# Patient Record
Sex: Female | Born: 1935 | Race: White | Hispanic: No | Smoking: Former smoker
Health system: Southern US, Community
[De-identification: ages and names within clinical notes are randomized; demographics above are authoritative.]

## PROBLEM LIST (undated history)

## (undated) DIAGNOSIS — J45909 Unspecified asthma, uncomplicated: Secondary | ICD-10-CM

## (undated) DIAGNOSIS — F329 Major depressive disorder, single episode, unspecified: Secondary | ICD-10-CM

## (undated) DIAGNOSIS — E539 Vitamin B deficiency, unspecified: Secondary | ICD-10-CM

## (undated) DIAGNOSIS — R51 Headache: Secondary | ICD-10-CM

## (undated) DIAGNOSIS — K219 Gastro-esophageal reflux disease without esophagitis: Secondary | ICD-10-CM

## (undated) DIAGNOSIS — H409 Unspecified glaucoma: Secondary | ICD-10-CM

## (undated) DIAGNOSIS — R54 Age-related physical debility: Secondary | ICD-10-CM

## (undated) DIAGNOSIS — N3281 Overactive bladder: Secondary | ICD-10-CM

## (undated) DIAGNOSIS — F0390 Unspecified dementia without behavioral disturbance: Secondary | ICD-10-CM

## (undated) DIAGNOSIS — D649 Anemia, unspecified: Secondary | ICD-10-CM

## (undated) DIAGNOSIS — I499 Cardiac arrhythmia, unspecified: Secondary | ICD-10-CM

## (undated) DIAGNOSIS — T8859XA Other complications of anesthesia, initial encounter: Secondary | ICD-10-CM

## (undated) DIAGNOSIS — E5111 Dry beriberi: Secondary | ICD-10-CM

## (undated) DIAGNOSIS — G4700 Insomnia, unspecified: Secondary | ICD-10-CM

## (undated) DIAGNOSIS — J209 Acute bronchitis, unspecified: Secondary | ICD-10-CM

## (undated) DIAGNOSIS — I471 Supraventricular tachycardia: Secondary | ICD-10-CM

## (undated) DIAGNOSIS — E119 Type 2 diabetes mellitus without complications: Secondary | ICD-10-CM

## (undated) DIAGNOSIS — I509 Heart failure, unspecified: Secondary | ICD-10-CM

## (undated) DIAGNOSIS — E785 Hyperlipidemia, unspecified: Secondary | ICD-10-CM

## (undated) DIAGNOSIS — J309 Allergic rhinitis, unspecified: Secondary | ICD-10-CM

## (undated) DIAGNOSIS — T4145XA Adverse effect of unspecified anesthetic, initial encounter: Secondary | ICD-10-CM

## (undated) DIAGNOSIS — I10 Essential (primary) hypertension: Secondary | ICD-10-CM

## (undated) DIAGNOSIS — R519 Headache, unspecified: Secondary | ICD-10-CM

## (undated) DIAGNOSIS — I4719 Other supraventricular tachycardia: Secondary | ICD-10-CM

## (undated) DIAGNOSIS — R0600 Dyspnea, unspecified: Secondary | ICD-10-CM

## (undated) DIAGNOSIS — K5900 Constipation, unspecified: Secondary | ICD-10-CM

## (undated) DIAGNOSIS — M810 Age-related osteoporosis without current pathological fracture: Secondary | ICD-10-CM

## (undated) DIAGNOSIS — H04129 Dry eye syndrome of unspecified lacrimal gland: Secondary | ICD-10-CM

## (undated) DIAGNOSIS — E876 Hypokalemia: Secondary | ICD-10-CM

## (undated) DIAGNOSIS — F32A Depression, unspecified: Secondary | ICD-10-CM

## (undated) DIAGNOSIS — J449 Chronic obstructive pulmonary disease, unspecified: Secondary | ICD-10-CM

## (undated) DIAGNOSIS — H269 Unspecified cataract: Secondary | ICD-10-CM

## (undated) HISTORY — PX: DILATION AND CURETTAGE OF UTERUS: SHX78

## (undated) HISTORY — DX: Unspecified cataract: H26.9

## (undated) HISTORY — DX: Type 2 diabetes mellitus without complications: E11.9

## (undated) HISTORY — PX: DILATION AND CURETTAGE, DIAGNOSTIC / THERAPEUTIC: SUR384

## (undated) HISTORY — DX: Age-related osteoporosis without current pathological fracture: M81.0

## (undated) HISTORY — DX: Headache: R51

## (undated) HISTORY — PX: FOOT SURGERY: SHX648

## (undated) HISTORY — PX: HAND SURGERY: SHX662

## (undated) HISTORY — DX: Headache, unspecified: R51.9

## (undated) HISTORY — PX: ANKLE SURGERY: SHX546

## (undated) HISTORY — DX: Essential (primary) hypertension: I10

## (undated) HISTORY — PX: EYE SURGERY: SHX253

## (undated) HISTORY — DX: Heart failure, unspecified: I50.9

---

## 1998-06-06 ENCOUNTER — Other Ambulatory Visit: Admission: RE | Admit: 1998-06-06 | Discharge: 1998-06-06 | Payer: Self-pay | Admitting: Family Medicine

## 1999-05-07 ENCOUNTER — Ambulatory Visit (HOSPITAL_COMMUNITY): Admission: RE | Admit: 1999-05-07 | Discharge: 1999-05-07 | Payer: Self-pay | Admitting: Family Medicine

## 1999-06-23 ENCOUNTER — Other Ambulatory Visit: Admission: RE | Admit: 1999-06-23 | Discharge: 1999-06-23 | Payer: Self-pay | Admitting: Family Medicine

## 2000-09-07 ENCOUNTER — Encounter: Payer: Self-pay | Admitting: Family Medicine

## 2000-09-07 ENCOUNTER — Encounter: Admission: RE | Admit: 2000-09-07 | Discharge: 2000-09-07 | Payer: Self-pay | Admitting: Family Medicine

## 2001-06-21 ENCOUNTER — Encounter: Payer: Self-pay | Admitting: Emergency Medicine

## 2001-06-21 ENCOUNTER — Emergency Department (HOSPITAL_COMMUNITY): Admission: EM | Admit: 2001-06-21 | Discharge: 2001-06-21 | Payer: Self-pay | Admitting: Emergency Medicine

## 2001-07-31 ENCOUNTER — Emergency Department (HOSPITAL_COMMUNITY): Admission: AC | Admit: 2001-07-31 | Discharge: 2001-07-31 | Payer: Self-pay

## 2001-07-31 ENCOUNTER — Encounter: Payer: Self-pay | Admitting: Emergency Medicine

## 2001-09-18 ENCOUNTER — Other Ambulatory Visit: Admission: RE | Admit: 2001-09-18 | Discharge: 2001-09-18 | Payer: Self-pay | Admitting: Family Medicine

## 2002-10-09 ENCOUNTER — Other Ambulatory Visit: Admission: RE | Admit: 2002-10-09 | Discharge: 2002-10-09 | Payer: Self-pay | Admitting: Obstetrics and Gynecology

## 2002-11-16 ENCOUNTER — Emergency Department (HOSPITAL_COMMUNITY): Admission: EM | Admit: 2002-11-16 | Discharge: 2002-11-17 | Payer: Self-pay | Admitting: Emergency Medicine

## 2002-11-16 ENCOUNTER — Encounter: Payer: Self-pay | Admitting: Emergency Medicine

## 2003-01-25 ENCOUNTER — Ambulatory Visit (HOSPITAL_COMMUNITY): Admission: RE | Admit: 2003-01-25 | Discharge: 2003-01-25 | Payer: Self-pay | Admitting: Cardiology

## 2003-06-06 ENCOUNTER — Emergency Department (HOSPITAL_COMMUNITY): Admission: EM | Admit: 2003-06-06 | Discharge: 2003-06-06 | Payer: Self-pay | Admitting: Emergency Medicine

## 2003-06-17 ENCOUNTER — Encounter: Admission: RE | Admit: 2003-06-17 | Discharge: 2003-06-17 | Payer: Self-pay | Admitting: Family Medicine

## 2004-11-24 ENCOUNTER — Other Ambulatory Visit: Admission: RE | Admit: 2004-11-24 | Discharge: 2004-11-24 | Payer: Self-pay | Admitting: Family Medicine

## 2004-12-10 ENCOUNTER — Ambulatory Visit: Payer: Self-pay | Admitting: Internal Medicine

## 2004-12-29 ENCOUNTER — Ambulatory Visit: Payer: Self-pay | Admitting: Internal Medicine

## 2009-01-06 ENCOUNTER — Ambulatory Visit (HOSPITAL_COMMUNITY): Admission: RE | Admit: 2009-01-06 | Discharge: 2009-01-06 | Payer: Self-pay | Admitting: Ophthalmology

## 2009-03-03 ENCOUNTER — Ambulatory Visit (HOSPITAL_COMMUNITY): Admission: RE | Admit: 2009-03-03 | Discharge: 2009-03-03 | Payer: Self-pay | Admitting: Ophthalmology

## 2009-07-26 HISTORY — PX: KNEE SURGERY: SHX244

## 2010-07-07 ENCOUNTER — Encounter
Admission: RE | Admit: 2010-07-07 | Discharge: 2010-07-07 | Payer: Self-pay | Source: Home / Self Care | Attending: Family Medicine | Admitting: Family Medicine

## 2010-09-11 ENCOUNTER — Other Ambulatory Visit: Payer: Self-pay | Admitting: Orthopedic Surgery

## 2010-09-11 DIAGNOSIS — R609 Edema, unspecified: Secondary | ICD-10-CM

## 2010-09-14 ENCOUNTER — Ambulatory Visit
Admission: RE | Admit: 2010-09-14 | Discharge: 2010-09-14 | Disposition: A | Discharge status: Home / Self Care | Payer: Medicare Other | Source: Ambulatory Visit | Attending: Orthopedic Surgery | Admitting: Orthopedic Surgery

## 2010-09-14 DIAGNOSIS — R609 Edema, unspecified: Secondary | ICD-10-CM

## 2010-11-01 LAB — CBC
HCT: 34.3 % — ABNORMAL LOW (ref 36.0–46.0)
Hemoglobin: 11.4 g/dL — ABNORMAL LOW (ref 12.0–15.0)
MCHC: 33.3 g/dL (ref 30.0–36.0)
MCV: 81.8 fL (ref 78.0–100.0)
Platelets: 225 10*3/uL (ref 150–400)
RBC: 4.19 MIL/uL (ref 3.87–5.11)
RDW: 15.3 % (ref 11.5–15.5)
WBC: 7.2 10*3/uL (ref 4.0–10.5)

## 2010-11-01 LAB — BASIC METABOLIC PANEL
BUN: 14 mg/dL (ref 6–23)
CO2: 29 mEq/L (ref 19–32)
Calcium: 9.3 mg/dL (ref 8.4–10.5)
Chloride: 101 mEq/L (ref 96–112)
Creatinine, Ser: 0.83 mg/dL (ref 0.4–1.2)
GFR calc Af Amer: >60 mL/min (ref >60)
GFR calc non Af Amer: >60 mL/min (ref >60)
Glucose, Bld: 165 mg/dL — ABNORMAL HIGH (ref 70–99)
Potassium: 3.6 mEq/L (ref 3.5–5.1)
Sodium: 138 mEq/L (ref 135–145)

## 2010-11-01 LAB — GLUCOSE, CAPILLARY
Glucose-Capillary: 132 mg/dL — ABNORMAL HIGH (ref 70–99)
Glucose-Capillary: 160 mg/dL — ABNORMAL HIGH (ref 70–99)

## 2010-11-01 LAB — PROTIME-INR
INR: 0.9 (ref 0.00–1.49)
Prothrombin Time: 11.8 seconds (ref 11.6–15.2)

## 2010-11-01 LAB — APTT: aPTT: 27 seconds (ref 24–37)

## 2010-11-02 LAB — CBC
HCT: 36.3 % (ref 36.0–46.0)
Hemoglobin: 12.1 g/dL (ref 12.0–15.0)
MCHC: 33.3 g/dL (ref 30.0–36.0)
MCV: 83.2 fL (ref 78.0–100.0)
Platelets: 266 10*3/uL (ref 150–400)
RBC: 4.36 MIL/uL (ref 3.87–5.11)
RDW: 15.4 % (ref 11.5–15.5)
WBC: 8.3 10*3/uL (ref 4.0–10.5)

## 2010-11-02 LAB — BASIC METABOLIC PANEL
BUN: 13 mg/dL (ref 6–23)
CO2: 30 mEq/L (ref 19–32)
Calcium: 9.7 mg/dL (ref 8.4–10.5)
Chloride: 102 mEq/L (ref 96–112)
Creatinine, Ser: 0.85 mg/dL (ref 0.4–1.2)
GFR calc Af Amer: >60 mL/min (ref >60)
GFR calc non Af Amer: >60 mL/min (ref >60)
Glucose, Bld: 122 mg/dL — ABNORMAL HIGH (ref 70–99)
Potassium: 3.8 mEq/L (ref 3.5–5.1)
Sodium: 139 mEq/L (ref 135–145)

## 2010-11-02 LAB — PROTIME-INR
INR: 0.9 (ref 0.00–1.49)
Prothrombin Time: 12.3 seconds (ref 11.6–15.2)

## 2010-11-02 LAB — GLUCOSE, CAPILLARY
Glucose-Capillary: 117 mg/dL — ABNORMAL HIGH (ref 70–99)
Glucose-Capillary: 96 mg/dL (ref 70–99)

## 2010-11-02 LAB — APTT: aPTT: 29 seconds (ref 24–37)

## 2010-12-08 NOTE — Op Note (Signed)
NAME:  Jocelyn Schultz, Jocelyn Schultz               ACCOUNT NO.:  [id number]   MEDICAL RECORD NO.:  [id number]          PATIENT TYPE:  AMB   LOCATION:  SDS                          FACILITY:  [facility]   PHYSICIAN:  Alford Highland. Rankin, M.D.   DATE OF BIRTH:  [birth year]   DATE OF PROCEDURE:  01/06/2009  DATE OF DISCHARGE:                               OPERATIVE REPORT   PREOPERATIVE DIAGNOSIS:  Nuclear sclerotic cataract, left eye with  impact of visual function.   POSTOPERATIVE DIAGNOSIS:  Nuclear sclerotic cataract, left eye with  impact of visual function.   PROCEDURE:  Phacoemulsification, extracapsular cataract extraction with  intraocular lens placement posterior chamber in the capsular bag, left  eye.   ANESTHESIA:  Local retrobulbar, monitored anesthesia control.   SURGEON:  Alford Highland. Rankin, MD   INDICATIONS FOR PROCEDURE:  The patient is a 77 year old woman who has  profound vision loss on basis of nuclear sclerotic cataract and  cloudiness of vision.  She understands this is an attempt to remove the  cloudiness opacity into place with intraocular lens in place, which is  clear to enhance visual function.  The patient understood the risk of  anesthesia including rare occurrence of death, loss of the eye including  but not limited to hemorrhage, infection, scarring, need for another  surgery, no change in vision, loss of vision, and progressive disease  despite intervention.   After appropriate signed consent was obtained, the patient was taken to  the operating room.  In the operating room, appropriate monitors  followed by mild sedation.  Xylocaine 2% injected, 5 mL retrobulbar with  additional 5 mL laterally in fashion of modified Darel Hong.  The left  periocular region was sterilely prepped and draped in the usual sterile  fashion.  Lid speculum applied.  Diamond blade was then used to create a  biplanar clear corneal incision.  Anterior chamber was then opened up.  The anterior chamber was  deepened with Viscoat.  Bent cystitome needle  was then created and this was used to create a circular capsulorrhexis.  No complications occurred.  Hydrodissection delineation was then carried  out in the bag.  Phacoemulsification was then carried out and a Y groove  incision was then made and nucleus chopper was then used to crack the  lens into fragments.  This allowed for removal in segments.  The plate  was left over and this nuclear plate was able to be removed without  difficulty.  Irrigation aspiration was then used to clear the remaining  cortex.  Capsular bag ring was intact.  At this time, the [employer] Ac[employer]of  IQ lens was then placed into the capsular bag left in the vertical  position with excellent centration with haptics and the optic in the  bag.  The power was +18.5, serial number is [id number] and the style  was [id number].  The patient then had clean up of the Viscoat in the  anterior chamber.  The wound was then closed with a safety 10-0  nylon suture and the knot was bury to rotate.  Subconjunctival Decadron  applied.  Sterile patch and Fox shield applied.  The patient tolerated  the procedure without complication.  The patient was taken to short stay  in good and stable condition.      Alford Highland Rankin, M.D.  Electronically Signed     GAR/MEDQ  D:  01/06/2009  T:  01/07/2009  Job:  [id number]

## 2010-12-11 NOTE — Cardiovascular Report (Signed)
NAME:  Jocelyn Schultz, Jocelyn Schultz                         ACCOUNT NO.:  [id number]   MEDICAL RECORD NO.:  [id number]                   PATIENT TYPE:  OIB   LOCATION:  2899                                 FACILITY:  [facility]   PHYSICIAN:  Salvadore Farber, M.D.             DATE OF BIRTH:  06/22/1931   DATE OF PROCEDURE:  01/25/2003  DATE OF DISCHARGE:  01/25/2003                              CARDIAC CATHETERIZATION   PROCEDURE:  Left heart catheterization, right heart catheterization, left  ventriculography, coronary angiography, abdominal aortography, selective  right renal angiography.   INDICATION:  Ms. Lannan is a 77 year old lady with history of hypertension,  diabetes mellitus, and prior DVT who presents with several months of  progressive exertional dyspnea.  She also has some occasional substernal  chest discomfort occurring both with and without exertion.  Due to her risk  factors and marked dyspnea, she was referred for coronary angiography and  right heart catheterization to both measure pulmonary pressures and exclude  myocardial ischemias and etiology of her dyspnea.   PROCEDURAL TECHNIQUE:  Informed consent was obtained.  Under 1% lidocaine  local anesthesia, an 8 French sheath was placed in the right femoral vein  and a 6 French sheath in the right femoral artery using the modified  Seldinger technique.  Right heart catheterization was performed using a  balloon-tipped catheter.  Coronary angiography was performed using JL-4 and  JR-4 catheter.  Ventriculography was performed in the RAO projection by  power injection in the pigtail catheter.  The pigtail catheter was then  pulled back to the abdominal aorta.  Abdominal  aortography was performed  via power injection.  There was a questionable stenosis at the ostium of the  right renal artery on aortography.  Therefore, the JR-4 catheter was  reintroduced and used to selectively engage the right renal artery.  Angiography was  performed by hand injection.  Catheters were then removed.  The patient tolerated the procedure well and was transferred to the holding  room in stable condition.  Sheaths are to be removed there.   COMPLICATIONS:  None.   FINDINGS:  1. Hemodynamics:  RA:  19/17/15, RV:  46/11/18, PA:  46/20/32, PCW:     24/29/32.  Aorta:  176/72/109, LV:  152/4/22.  Cardiac output 4.3,     cardiac index 2.4.  2. No mitral stenosis or aortic stenosis.  3. Ventriculography:  EF 60% without regional wall motion abnormality.  No     mitral regurgitation.   CORONARY ANGIOGRAPHY:  1. Left main:  Angiographically  normal.  2. LAD:  The LAD is a large vessel wrapping around the apex of the heart and     giving rise to a single diagonal branch.  There is a 40% stenosis of the proximal vessel and a 50% stenosis overlying  the takeoff of the diagonal branch.  1. Circumflex:  The circumflex is a large vessel giving rise  to a single     obtuse marginal.  It is angiographically normal.  2. RCA:  The RCA is a moderate size dominant vessel.  There are luminal     irregularities throughout its course.   ABDOMINAL AORTOGRAPHY AND RENAL ANGIOGRAPHY:  Normal abdominal aorta.  There  are single renal arteries bilaterally.  The left renal artery is normal.  The right has a 30% stenosis at its ostium.    IMPRESSION/RECOMMENDATIONS:  1. Moderate coronary artery disease.  Will obtain adenosine Cardiolite to     exclude ischemia in the left anterior descending territory though I think     it unlikely.  2. Elevated left and right heart filling pressures consistent with diastolic     heart failure.  Will increase Lasix to 80 mg per day and follow for     clinical improvement.                                                 Salvadore Farber, M.D.    WED/MEDQ  D:  02/19/2003  T:  02/20/2003  Job:  [id number]   cc:   Dr. Donny Pique

## 2010-12-11 NOTE — Discharge Summary (Signed)
NAME:  Jocelyn Schultz, Jocelyn Schultz                         ACCOUNT NO.:  [id number]   MEDICAL RECORD NO.:  [id number]                   PATIENT TYPE:  OIB   LOCATION:  2899                                 FACILITY:  [facility]   PHYSICIAN:  Salvadore Farber, M.D.             DATE OF BIRTH:  1931-03-30   DATE OF ADMISSION:  01/25/2003  DATE OF DISCHARGE:  01/25/2003                           DISCHARGE SUMMARY - REFERRING   DISCHARGE DIAGNOSES:  1. Chest pain.  2. Congestive heart failure.  3. Rule out obstructive coronary artery disease.  Cardiac catheterization     done July 2 shows tandem stenoses to the left anterior descending 40 and     50%, 50% at the ostium of the first diagonal; the right coronary artery     has luminal irregularities.  The ejection fraction 60% without right     ventricular wall motion abnormalities.  No aortic stenosis.  No mitral     regurgitation.  Single renal arteries bilaterally.  Left is normal, right     with a 30% stenosis.  Finding of moderate coronary artery disease.     Follow-up adenosine Cardiolite study at [facility].   SECONDARY DIAGNOSES:  1. Type 2 diabetes.  2. Obesity.  3. Hypertension.  4. Oxygen dependence the last month for progressive dyspnea.  5. Dyslipidemia.   PROCEDURE:  Left and right heart catheterization 01/25/03, Salvadore Farber,  M.D.  Coronary angiography with selective renal artery angiogram.   DISCHARGE:  The patient was discharged 01/25/03 after obligatory rest period  after undergoing left and right heart catheterization.  At the time of  discharge the patient's catheterization site is without swelling, erythema,  or drainage.  The patient is not complaining of any pain there.  She is  ambulatory.  Her mental status is clear.  She does require oxygen  assistance, and she has been on this chronically for the last month at home.  Her medications were adjusted in the post-catheterization period.   She goes home on her  regular medicines except:  1. Lasix 80 mg daily.  2. Potassium chloride 40 mEq daily.  3. Nitroglycerin 0.4 mg sublingually every five minutes x3 as needed for     chest pain.   Her other medications include:  1. Nexium 40 mg daily.  2. Toprol XL 75 mg daily.  3. Enteric-coated aspirin 325 mg daily.  4. Zestoretic 20/12.5 mg two daily.  5.     Neurontin 300 mg b.i.d.  6. Metformin 1000 mg b.i.d., hold 48 hours after the procedure.   Electrocardiogram done 01/25/03 shows sinus bradycardia without ST changes.  No evidence of ischemia or injury.     Maple Mirza, P.A.                    Salvadore Farber, M.D.    GM/MEDQ  D:  01/25/2003  T:  01/26/2003  Job:  [id number]   cc:   Burnell Blanks, M.D., [place], [place]    cc:   Burnell Blanks, M.D., [place], [place]

## 2011-05-19 ENCOUNTER — Other Ambulatory Visit: Payer: Self-pay | Admitting: Family Medicine

## 2011-05-19 DIAGNOSIS — Z1231 Encounter for screening mammogram for malignant neoplasm of breast: Secondary | ICD-10-CM

## 2011-07-09 ENCOUNTER — Ambulatory Visit
Admission: RE | Admit: 2011-07-09 | Discharge: 2011-07-09 | Disposition: A | Discharge status: Home / Self Care | Payer: Medicare Other | Source: Ambulatory Visit | Attending: Family Medicine | Admitting: Family Medicine

## 2011-07-09 DIAGNOSIS — Z1231 Encounter for screening mammogram for malignant neoplasm of breast: Secondary | ICD-10-CM

## 2012-02-24 ENCOUNTER — Other Ambulatory Visit: Payer: Self-pay | Admitting: Dermatology

## 2012-09-20 ENCOUNTER — Other Ambulatory Visit: Payer: Self-pay | Admitting: Internal Medicine

## 2012-09-20 ENCOUNTER — Ambulatory Visit
Admission: RE | Admit: 2012-09-20 | Discharge: 2012-09-20 | Disposition: A | Discharge status: Home / Self Care | Payer: Medicare Other | Source: Ambulatory Visit | Attending: Internal Medicine | Admitting: Internal Medicine

## 2012-09-20 DIAGNOSIS — M79609 Pain in unspecified limb: Secondary | ICD-10-CM

## 2012-10-26 ENCOUNTER — Other Ambulatory Visit: Payer: Self-pay | Admitting: Family Medicine

## 2012-10-26 DIAGNOSIS — Z1231 Encounter for screening mammogram for malignant neoplasm of breast: Secondary | ICD-10-CM

## 2012-10-30 ENCOUNTER — Other Ambulatory Visit: Payer: Self-pay | Admitting: Family Medicine

## 2012-10-30 DIAGNOSIS — M8580 Other specified disorders of bone density and structure, unspecified site: Secondary | ICD-10-CM

## 2012-12-07 ENCOUNTER — Ambulatory Visit
Admission: RE | Admit: 2012-12-07 | Discharge: 2012-12-07 | Disposition: A | Discharge status: Home / Self Care | Payer: Medicare Other | Source: Ambulatory Visit | Attending: Family Medicine | Admitting: Family Medicine

## 2012-12-07 DIAGNOSIS — M8580 Other specified disorders of bone density and structure, unspecified site: Secondary | ICD-10-CM

## 2012-12-07 DIAGNOSIS — Z1231 Encounter for screening mammogram for malignant neoplasm of breast: Secondary | ICD-10-CM

## 2013-01-30 ENCOUNTER — Other Ambulatory Visit: Payer: Self-pay | Admitting: Family Medicine

## 2013-01-30 DIAGNOSIS — R1011 Right upper quadrant pain: Secondary | ICD-10-CM

## 2013-01-31 ENCOUNTER — Ambulatory Visit
Admission: RE | Admit: 2013-01-31 | Discharge: 2013-01-31 | Disposition: A | Discharge status: Home / Self Care | Payer: Medicare Other | Source: Ambulatory Visit | Attending: Family Medicine | Admitting: Family Medicine

## 2013-01-31 DIAGNOSIS — R1011 Right upper quadrant pain: Secondary | ICD-10-CM

## 2013-01-31 MED ORDER — IOHEXOL 300 MG/ML  SOLN
100.0000 mL | Freq: Once | INTRAMUSCULAR | Status: AC | PRN
  Administered 2013-01-31: 100 mL via INTRAVENOUS

## 2013-01-31 MED ORDER — IOHEXOL 300 MG/ML  SOLN
30.0000 mL | Freq: Once | INTRAMUSCULAR | Status: AC | PRN
  Administered 2013-01-31: 30 mL via ORAL

## 2013-10-01 ENCOUNTER — Ambulatory Visit
Admission: RE | Admit: 2013-10-01 | Discharge: 2013-10-01 | Disposition: A | Discharge status: Home / Self Care | Payer: Medicare Other | Source: Ambulatory Visit | Attending: Family Medicine | Admitting: Family Medicine

## 2013-10-01 ENCOUNTER — Other Ambulatory Visit: Payer: Self-pay | Admitting: Family Medicine

## 2013-10-01 DIAGNOSIS — R109 Unspecified abdominal pain: Secondary | ICD-10-CM

## 2013-11-07 ENCOUNTER — Other Ambulatory Visit: Payer: Self-pay | Admitting: Family Medicine

## 2013-11-07 DIAGNOSIS — Z1231 Encounter for screening mammogram for malignant neoplasm of breast: Secondary | ICD-10-CM

## 2013-12-10 ENCOUNTER — Ambulatory Visit: Payer: Medicare Other

## 2013-12-21 ENCOUNTER — Ambulatory Visit
Admission: RE | Admit: 2013-12-21 | Discharge: 2013-12-21 | Disposition: A | Discharge status: Home / Self Care | Payer: Medicare Other | Source: Ambulatory Visit | Attending: Family Medicine | Admitting: Family Medicine

## 2013-12-21 DIAGNOSIS — Z1231 Encounter for screening mammogram for malignant neoplasm of breast: Secondary | ICD-10-CM

## 2014-09-23 ENCOUNTER — Other Ambulatory Visit (INDEPENDENT_AMBULATORY_CARE_PROVIDER_SITE_OTHER): Payer: Self-pay | Admitting: Surgery

## 2014-09-23 NOTE — H&P (Signed)
Jocelyn Schultz 09/23/2014 10:57 AM Location: [facility] Patient #: [id number] DOB: 05/29/1931 Divorced / Language: [place] / Race: White Female History of Present Illness Jocelyn Schultz(Jocelyn Davitt C. Mckinzi [name] MD; 09/23/2014 11:46 AM) The patient is a 81 year old female who presents for an evaluation of a hernia. Patient comes today with her sister underwent primary treatment of her primary care physician for concern of abdominal pain and umbilical hernia. Pleasant 81 year old female. She comes today with her sister since she claims her memory his sometimes off. No history of prior surgeries. Normal colonoscopies in the past. She struggled with some abdominal pain and swelling around her belly button. Been there for years. However has become more sensitive. Worse over the past year. She's had an episode or two words not feels very hard and tender. Had mostly been reassured by her primary care physician but with with increasing frequency of symptoms, surgical consultation considered. She normally has one or two bowel movements a day. She has some pain in her RIGHT lower abdomen as well. Had a complaint of this summer two thousand fourteen. CAT scan showed small umbilical hernia. Mild stool burden on RIGHT colon. She tends to take prunes and has a bowel movement once or twice a day. No severe bouts of constipation or diarrhea. She normally can eat most anything she wants. She tends to avoid spicy foods. Has had some mild reflux but well controlled on Prilosec. That has not changed recently. She can walk about ten or fifteen minutes before she has to stop. No history of heart attack or stroke. She does not smoke. *RADIOLOGY REPORT* Clinical Data: Right abdominal pain x1 month, worse over the past 6 days CT ABDOMEN AND PELVIS WITH CONTRAST Technique: Multidetector CT imaging of the abdomen and pelvis was performed following the standard protocol during bolus administration of intravenous  contrast. Contrast: 100 ml Omnipaque-300 IV Comparison: None. Findings: Mild linear scarring in the left lower lobe. Coronary atherosclerosis. Liver, spleen pancreas, and adrenal glands are within normal limits. Gallbladder is unremarkable. No intrahepatic or extrahepatic ductal dilatation. 3.1 cm posterior interpolar right renal cyst. Left kidney is within normal limits. No hydronephrosis. No evidence of bowel obstruction. Normal appendix. Mild sigmoid diverticulosis, without associated inflammatory changes. Moderate stool in the right colon. Atherosclerotic calcifications of the abdominal aorta and branch vessels. No abdominopelvic ascites. No suspicious abdominopelvic lymphadenopathy. Uterus and bilateral ovaries unremarkable. Bladder is mildly trabeculated. Small fat-containing periumbilical hernia (series 2/image 59). Degenerative changes of the visualized thoracolumbar spine. IMPRESSION: No evidence of bowel obstruction. Normal appendix. Moderate stool in the right colon. Mildly trabeculated bladder. Small fat-containing periumbilical hernia. Original Report Authenticated By: Charline BillsSriyesh Krishnan, M.D.  External Result Report External Result Report <[email or web address]> Imaging Imaging Information <[email or web address]> Signed by Signed Date/Time Phone Pager Charline BillsKRISHNAN, SRIYESH 01/31/2013 3:19 PM [phone] Other Problems Jocelyn Schultz(Alisha Spillers, MA; 09/23/2014 10:57 AM) Arthritis Back Pain Bladder Problems Diabetes Mellitus Gastroesophageal Reflux Disease Hemorrhoids Migraine Headache Pulmonary Embolism / Blood Clot in Legs  Past Surgical History Jocelyn Schultz(Alisha Spillers, MA; 09/23/2014 10:57 AM) Cataract Surgery Bilateral. Foot Surgery Left. Knee Surgery Right. Oral Surgery  Diagnostic Studies History Jocelyn Schultz(Alisha [place]urd[place]ill[place], [place]; 09/23/2014 10:57 AM) Colonoscopy 1-5 years ago Mammogram 1-3 years ago  Allergies Ethlyn Gallery(Alisha Spillers, MA; 09/23/2014  11:02 AM) No Known Drug Allergies 09/23/2014  Medication History (Alisha Spillers, MA; 09/23/2014 11:08 AM) Tamsulosin HCl (0.4MG  Capsule, Oral) Active. Myrbetriq (50MG  Tablet ER 24HR, Oral) Active. Azo-Standard (95MG  Tablet, Oral) Active. Vitamin D3 Active. Ferrous Sulfate (325 (65 Fe)MG Tablet, Oral two times  daily) Active. PrednisoLONE Acetate (1% Suspension, Ophthalmic) Active. Vigamox (0.5% Solution, Ophthalmic) Active. Aspirin EC (  Tablet DR, Oral) Active. Vytorin (10-10MG  Tablet, Oral) Active. Insulin Syringe (1cc/29G) Active. Levemir FlexTouch (100UNIT/ML Soln Pen-inj, Subcutaneous) Active. Invokana (  Tablet, Oral) Active. Acarbose (  Tablet, Oral) Active. LORazepam (  Tablet, Oral) Active. Potassium Gluconate (2.5MEQ Tablet, Oral) Active. NexIUM (  Packet, Oral) Active. AmLODIPine Besylate (  Tablet, Oral two times daily) Active. MetFORMIN HCl (  Tablet, Oral two times daily) Active. Zestril (  Tablet, Oral two times daily) Active. Medications Reconciled  Social History Jocelyn Schultz [place], [place]; 09/23/2014 10:57 AM) Caffeine use Carbonated beverages, Coffee, Tea. No drug use Tobacco use Former smoker.  Family History Jocelyn Schultz [place], [place]; 09/23/2014 10:57 AM) Arthritis Brother, Father, Mother, Sister. Breast Cancer Sister. Cancer Mother. Diabetes Mellitus Daughter, Father, Sister. Heart Disease Family Members In General, Mother, Sister. Heart disease in female family member before age 61 Heart disease in female family member before age 54 Hypertension Daughter. Malignant Neoplasm Of Pancreas Family Members In General. Migraine Headache Brother, Mother, Sister.  Pregnancy / Birth History Jocelyn Schultz [place], [place]; 09/23/2014 10:57 AM) Age at menarche 15 years. Age of menopause <47 Gravida 3 Maternal age 77-25 Para 3     Review of Systems Jocelyn Schultz [place]; 09/23/2014 10:57 AM) General Present- Fatigue and  Night Sweats. Not Present- Appetite Loss, Chills, Fever, Weight Gain and Weight Loss. Skin Present- Change in Wart/Mole. Not Present- Dryness, Hives, Jaundice, New Lesions, Non-Healing Wounds, Rash and Ulcer. HEENT Present- Earache, Sore Throat and Visual Disturbances. Not Present- Hearing Loss, Hoarseness, Nose Bleed, Oral Ulcers, Ringing in the Ears, Seasonal Allergies, Sinus Pain, Wears glasses/contact lenses and Yellow Eyes. Respiratory Present- Snoring. Not Present- Bloody sputum, Chronic Cough, Difficulty Breathing and Wheezing. Cardiovascular Present- Leg Cramps and Swelling of Extremities. Not Present- Chest Pain, Difficulty Breathing Lying Down, Palpitations, Rapid Heart Rate and Shortness of Breath. Gastrointestinal Present- Abdominal Pain, Bloating, Hemorrhoids and Rectal Pain. Not Present- Bloody Stool, Change in Bowel Habits, Chronic diarrhea, Constipation, Difficulty Swallowing, Excessive gas, Gets full quickly at meals, Indigestion, Nausea and Vomiting. Female Genitourinary Present- Frequency, Nocturia and Urgency. Not Present- Painful Urination and Pelvic Pain. Musculoskeletal Present- Back Pain. Not Present- Joint Pain, Joint Stiffness, Muscle Pain, Muscle Weakness and Swelling of Extremities. Neurological Present- Decreased Memory and Trouble walking. Not Present- Fainting, Headaches, Numbness, Seizures, Tingling, Tremor and Weakness. Psychiatric Not Present- Anxiety, Bipolar, Change in Sleep Pattern, Depression, Fearful and Frequent crying. Endocrine Present- Cold Intolerance. Not Present- Excessive Hunger, Hair Changes, Heat Intolerance, Hot flashes and New Diabetes. Hematology Present- Easy Bruising. Not Present- Excessive bleeding, Gland problems, HIV and Persistent Infections.  Vitals (Alisha Spillers MA; 09/23/2014 11:01 AM) 09/23/2014 10:58 AM Weight: 150 lb Height: 59in Body Surface Area: 1.68 m Body Mass Index: 30.3 kg/m Temp.: 65F(Oral)  Pulse: 76 (Regular)   BP: 152/70 (Sitting, Left Arm, Standard)     Physical Exam Jocelyn Sportsman MD; 09/23/2014 11:44 AM)  General Mental Status-Alert. General Appearance-Not in acute distress, Not Sickly. Orientation-Oriented X3. Hydration-Well hydrated. Voice-Normal.  Integumentary Global Assessment Upon inspection and palpation of skin surfaces of the - Axillae: non-tender, no inflammation or ulceration, no drainage. and Distribution of scalp and body hair is normal. General Characteristics Temperature - normal warmth is noted.  Head and Neck Head-normocephalic, atraumatic with no lesions or palpable masses. Face Global Assessment - atraumatic, no absence of expression. Neck Global Assessment - no abnormal movements, no bruit auscultated on the right, no bruit auscultated on the left, no decreased range of motion,  non-tender. Trachea-midline. Thyroid Gland Characteristics - non-tender.  Eye Eyeball - Left-Extraocular movements intact, No Nystagmus. Eyeball - Right-Extraocular movements intact, No Nystagmus. Cornea - Left-No Hazy. Cornea - Right-No Hazy. Sclera/Conjunctiva - Left-No scleral icterus, No Discharge. Sclera/Conjunctiva - Right-No scleral icterus, No Discharge. Pupil - Left-Direct reaction to light normal. Pupil - Right-Direct reaction to light normal.  ENMT Ears Pinna - Left - no drainage observed, no generalized tenderness observed. Right - no drainage observed, no generalized tenderness observed. Nose and Sinuses External Inspection of the Nose - no destructive lesion observed. Inspection of the nares - Left - quiet respiration. Right - quiet respiration. Mouth and Throat Lips - Upper Lip - no fissures observed, no pallor noted. Lower Lip - no fissures observed, no pallor noted. Nasopharynx - no discharge present. Oral Cavity/Oropharynx - Tongue - no dryness observed. Oral Mucosa - no cyanosis observed. Hypopharynx - no evidence of airway  distress observed.  Chest and Lung Exam Inspection Movements - Normal and Symmetrical. Accessory muscles - No use of accessory muscles in breathing. Palpation Palpation of the chest reveals - Non-tender. Auscultation Breath sounds - Normal and Clear.  Cardiovascular Auscultation Rhythm - Regular. Murmurs & Other Heart Sounds - Auscultation of the heart reveals - No Murmurs and No Systolic Clicks.  Abdomen Inspection Inspection of the abdomen reveals - No Visible peristalsis and No Abnormal pulsations. Umbilicus - No Bleeding, No Urine drainage. Palpation/Percussion Palpation and Percussion of the abdomen reveal - Soft, Non Tender, No Rebound tenderness, No Rigidity (guarding) and No Cutaneous hyperesthesia. Note: Soft. Obvious periumbilical hernia. 4 cm mass. Very sensitive. Reducible to 2 cm.  Some tenderness RIGHT lower quadrant. Worse with reducing hernia. No upper abdominal discomfort.   Female Genitourinary Sexual Maturity Tanner 5 - Adult hair pattern. Note: No vaginal bleeding nor discharge   Peripheral Vascular Upper Extremity Inspection - Left - No Cyanotic nailbeds, Not Ischemic. Right - No Cyanotic nailbeds, Not Ischemic. Note: One plus pitting edema to proximal shins   Neurologic Neurologic evaluation reveals -normal attention span and ability to concentrate, able to name objects and repeat phrases. Appropriate fund of knowledge , normal sensation and normal coordination. Mental Status Affect - not angry, not paranoid. Cranial Nerves-Normal Bilaterally. Gait-Normal.  Neuropsychiatric Mental status exam performed with findings of-able to articulate well with normal speech/language, rate, volume and coherence, thought content normal with ability to perform basic computations and apply abstract reasoning and no evidence of hallucinations, delusions, obsessions or homicidal/suicidal ideation. Note: Calm and pleasant. Some long-term memory loss. Tends  to be deferential to her sister.   Musculoskeletal Global Assessment Spine, Ribs and Pelvis - no instability, subluxation or laxity. Right Upper Extremity - no instability, subluxation or laxity. Note: Moderate kyphosis of thoracic spine   Lymphatic Head & Neck  General Head & Neck Lymphatics: Bilateral - Description - No Localized lymphadenopathy. Axillary  General Axillary Region: Bilateral - Description - No Localized lymphadenopathy. Femoral & Inguinal  Generalized Femoral & Inguinal Lymphatics: Left - Description - No Localized lymphadenopathy. Right - Description - No Localized lymphadenopathy.    Assessment & Plan Jocelyn Sportsman MD; 09/23/2014 11:43 AM)  VENTRAL HERNIA WITHOUT OBSTRUCTION OR GANGRENE (553.20  K43.9) Impression: Is very sensitive but reducible. Primarily umbilical. I think she would benefit from repair. Would plan laparoscopic approach. That'll allow me diagnostic laparoscopy to rule out any other intra-abdominal issues. Normal colonoscopy and CAT scan otherwise in the past five years argues against any major issue but.  Patient seems quite miserable  with the soreness and the hernia seems to be the most sensitive area. Would benefit from surgery. The patient tends to defer to her sister as she has some memory issues. They both seem to be leaning toward surgery but not ready make a decision yet. I gave her my card with handout literature and they will let us know.  Current Plans Schedule for Surgery The anatomy & physiology of the abdominal wall was discussed. The pathophysiology of hernias was discussed. Natural history risks without surgery including progeressive enlargement, pain, incarceration, & strangulation was discussed. Contributors to complications such as smoking, obesity, diabetes, prior surgery, etc were discussed.  I feel the risks of no intervention will lead to serious problems that outweigh the operative risks; therefore, I recommended  surgery to reduce and repair the hernia. I explained laparoscopic techniques with possible need for an open approach. I noted the probable use of mesh to patch and/or buttress the hernia repair  Risks such as bleeding, infection, abscess, need for further treatment, heart attack, death, and other risks were discussed. I noted a good likelihood this will help address the problem. Goals of post-operative recovery were discussed as well. Possibility that this will not correct all symptoms was explained. I stressed the importance of low-impact activity, aggressive pain control, avoiding constipation, & not pushing through pain to minimize risk of post-operative chronic pain or injury. Possibility of reherniation especially with smoking, obesity, diabetes, immunosuppression, and other health conditions was discussed. We will work to minimize complications.  An educational handout further explaining the pathology & treatment options was given as well. Questions were answered. The patient & her sister expresses understanding & wish to think about surgery. Pt Education - [facility] Hernia Post-Op HCI (Ena Demary): discussed with patient and provided information. Pt Education - [facility] Pain Control (Taylan Marez) Pt Education - [facility] Good Bowel Health (Shawnique Mariotti)  Jocelyn Schultz, M.D., F.A.C.S. Gastrointestinal and Minimally Invasive Surgery [facility] [address] [place], [place] [address] [phone] Main / Paging

## 2014-12-18 ENCOUNTER — Encounter: Payer: Self-pay | Admitting: *Deleted

## 2015-01-28 ENCOUNTER — Encounter: Payer: Self-pay | Admitting: Podiatry

## 2015-01-28 ENCOUNTER — Ambulatory Visit (INDEPENDENT_AMBULATORY_CARE_PROVIDER_SITE_OTHER): Payer: Medicare Other | Admitting: Podiatry

## 2015-01-28 VITALS — BP 139/82 | HR 104 | Temp 98.4°F | Resp 12

## 2015-01-28 DIAGNOSIS — B351 Tinea unguium: Secondary | ICD-10-CM | POA: Diagnosis not present

## 2015-01-28 DIAGNOSIS — M79675 Pain in left toe(s): Secondary | ICD-10-CM

## 2015-01-28 DIAGNOSIS — M79674 Pain in right toe(s): Secondary | ICD-10-CM

## 2015-01-28 DIAGNOSIS — M79676 Pain in unspecified toe(s): Secondary | ICD-10-CM

## 2015-01-28 NOTE — Progress Notes (Signed)
   Subjective:    Patient ID: Jocelyn Schultz, female    DOB: Dec 12, 1930, 81 y.o.   MRN: [id number]  HPI Patient presents here today with request for B/L toenail trim, used to trim but now needs a professional since becoming a diabetic.She says she has pain in her toes walking and wearing her shoes.  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  She presents for preventive foot care services.   Review of Systems  Constitutional: Positive for fever and chills.       Sweating  Eyes: Positive for pain and visual disturbance.  Cardiovascular: Positive for leg swelling.       Calf pain when walking  Gastrointestinal: Positive for abdominal pain.       Bloating  Endocrine:       Increased urination  Genitourinary: Positive for frequency.  Musculoskeletal: Positive for gait problem.  Neurological: Positive for dizziness and headaches.  Psychiatric/Behavioral: Positive for confusion.       Objective:   Physical Exam Objective: Review of past medical history, medications, social history and allergies were performed.  Vascular: Dorsalis pedis and posterior tibial pulses were palpable B/L, capillary refill was  WNL B/L, temperature gradient was WNL B/L   Skin:  No signs of symptoms of infection or ulcers on both feet  Nails: Thick disfigured discolored nails both feet.  No signs of paronychia.  Sensory: Phoebe PerchSemmes Weinstein monifilament WNL   Orthopedic: Orthopedic evaluation demonstrates all joints distal t ankle have full ROM without crepitus, muscle power WNL B/L       Assessment & Plan:  Onychomycosis.  IE  Debridement of onychomycosis

## 2015-03-31 ENCOUNTER — Inpatient Hospital Stay (HOSPITAL_COMMUNITY)
Admission: EM | Admit: 2015-03-31 | Discharge: 2015-04-03 | DRG: 309 | Disposition: A | Discharge status: Skilled Nursing Facility | Payer: Medicare Other | Attending: Internal Medicine | Admitting: Internal Medicine

## 2015-03-31 ENCOUNTER — Encounter (HOSPITAL_COMMUNITY): Payer: Self-pay | Admitting: Emergency Medicine

## 2015-03-31 ENCOUNTER — Emergency Department (HOSPITAL_COMMUNITY): Payer: Medicare Other

## 2015-03-31 DIAGNOSIS — E876 Hypokalemia: Secondary | ICD-10-CM

## 2015-03-31 DIAGNOSIS — I5022 Chronic systolic (congestive) heart failure: Secondary | ICD-10-CM | POA: Diagnosis present

## 2015-03-31 DIAGNOSIS — R55 Syncope and collapse: Secondary | ICD-10-CM

## 2015-03-31 DIAGNOSIS — I471 Supraventricular tachycardia: Secondary | ICD-10-CM | POA: Diagnosis present

## 2015-03-31 DIAGNOSIS — R296 Repeated falls: Secondary | ICD-10-CM | POA: Diagnosis present

## 2015-03-31 DIAGNOSIS — M810 Age-related osteoporosis without current pathological fracture: Secondary | ICD-10-CM | POA: Diagnosis present

## 2015-03-31 DIAGNOSIS — I10 Essential (primary) hypertension: Secondary | ICD-10-CM

## 2015-03-31 DIAGNOSIS — I509 Heart failure, unspecified: Secondary | ICD-10-CM

## 2015-03-31 DIAGNOSIS — R002 Palpitations: Secondary | ICD-10-CM | POA: Diagnosis not present

## 2015-03-31 DIAGNOSIS — I2510 Atherosclerotic heart disease of native coronary artery without angina pectoris: Secondary | ICD-10-CM | POA: Diagnosis present

## 2015-03-31 DIAGNOSIS — W19XXXA Unspecified fall, initial encounter: Secondary | ICD-10-CM

## 2015-03-31 DIAGNOSIS — Z833 Family history of diabetes mellitus: Secondary | ICD-10-CM

## 2015-03-31 DIAGNOSIS — Z8249 Family history of ischemic heart disease and other diseases of the circulatory system: Secondary | ICD-10-CM

## 2015-03-31 DIAGNOSIS — Z806 Family history of leukemia: Secondary | ICD-10-CM

## 2015-03-31 DIAGNOSIS — I4719 Other supraventricular tachycardia: Secondary | ICD-10-CM | POA: Diagnosis present

## 2015-03-31 DIAGNOSIS — E785 Hyperlipidemia, unspecified: Secondary | ICD-10-CM | POA: Diagnosis present

## 2015-03-31 DIAGNOSIS — E119 Type 2 diabetes mellitus without complications: Secondary | ICD-10-CM

## 2015-03-31 HISTORY — DX: Other supraventricular tachycardia: I47.19

## 2015-03-31 HISTORY — DX: Supraventricular tachycardia: I47.1

## 2015-03-31 LAB — CBC WITH DIFFERENTIAL/PLATELET
Basophils Absolute: 0 10*3/uL (ref 0.0–0.1)
Basophils Relative: 1 % (ref 0–1)
EOS ABS: 0.6 10*3/uL (ref 0.0–0.7)
EOS PCT: 8 % — AB (ref 0–5)
HCT: 38.5 % (ref 36.0–46.0)
Hemoglobin: 12.5 g/dL (ref 12.0–15.0)
LYMPHS ABS: 1.8 10*3/uL (ref 0.7–4.0)
Lymphocytes Relative: 24 % (ref 12–46)
MCH: 28.2 pg (ref 26.0–34.0)
MCHC: 32.5 g/dL (ref 30.0–36.0)
MCV: 86.7 fL (ref 78.0–100.0)
Monocytes Absolute: 0.7 10*3/uL (ref 0.1–1.0)
Monocytes Relative: 9 % (ref 3–12)
Neutro Abs: 4.5 10*3/uL (ref 1.7–7.7)
Neutrophils Relative %: 58 % (ref 43–77)
PLATELETS: 261 10*3/uL (ref 150–400)
RBC: 4.44 MIL/uL (ref 3.87–5.11)
RDW: 13.9 % (ref 11.5–15.5)
WBC: 7.6 10*3/uL (ref 4.0–10.5)

## 2015-03-31 LAB — COMPREHENSIVE METABOLIC PANEL
ALT: 13 U/L — AB (ref 14–54)
ANION GAP: 13 (ref 5–15)
AST: 21 U/L (ref 15–41)
Albumin: 3.4 g/dL — ABNORMAL LOW (ref 3.5–5.0)
Alkaline Phosphatase: 57 U/L (ref 38–126)
BUN: 10 mg/dL (ref 6–20)
CHLORIDE: 95 mmol/L — AB (ref 101–111)
CO2: 30 mmol/L (ref 22–32)
CREATININE: 0.9 mg/dL (ref 0.44–1.00)
Calcium: 10.1 mg/dL (ref 8.9–10.3)
GFR calc non Af Amer: 58 mL/min — ABNORMAL LOW (ref >60)
Glucose, Bld: 138 mg/dL — ABNORMAL HIGH (ref 65–99)
POTASSIUM: 3 mmol/L — AB (ref 3.5–5.1)
SODIUM: 138 mmol/L (ref 135–145)
Total Bilirubin: 0.6 mg/dL (ref 0.3–1.2)
Total Protein: 7.7 g/dL (ref 6.5–8.1)

## 2015-03-31 LAB — PROTIME-INR
INR: 1.04 (ref 0.00–1.49)
Prothrombin Time: 13.8 seconds (ref 11.6–15.2)

## 2015-03-31 LAB — URINALYSIS, ROUTINE W REFLEX MICROSCOPIC
BILIRUBIN URINE: NEGATIVE
GLUCOSE, UA: NEGATIVE mg/dL
HGB URINE DIPSTICK: NEGATIVE
KETONES UR: NEGATIVE mg/dL
Nitrite: NEGATIVE
PH: 6.5 (ref 5.0–8.0)
Protein, ur: NEGATIVE mg/dL
SPECIFIC GRAVITY, URINE: 1.013 (ref 1.005–1.030)
Urobilinogen, UA: 1 mg/dL (ref 0.0–1.0)

## 2015-03-31 LAB — CBG MONITORING, ED: GLUCOSE-CAPILLARY: 138 mg/dL — AB (ref 65–99)

## 2015-03-31 LAB — URINE MICROSCOPIC-ADD ON

## 2015-03-31 NOTE — ED Provider Notes (Signed)
CSN: [id number]     Arrival date & time 03/31/15  2016 History   First MD Initiated Contact with Patient 03/31/15 2209     Chief Complaint  Patient presents with  . Fall     (Consider location/radiation/quality/duration/timing/severity/associated sxs/prior Treatment) HPI Comments: Patient is an 81 yo F PMHx significant for DM2, HTN, CHF, HAs, cataracts presenting to the ED for evaluation of "swimmy headedness", palpitations, and six falls. Patient states she cannot speak and describes some blurred vision before the fall. The daughter states she does not lose consciousness after the fall, and these symptoms only last a few minutes at most. Denies any head trauma. She does endorse some scratches to her left forearm from scrapping it against the fridge. No history of A fib or stroke.    Past Medical History  Diagnosis Date  . DM2 (diabetes mellitus, type 2)   . HTN (hypertension)   . CHF (congestive heart failure)   . Headache   . Cataract   . Osteoporosis    Past Surgical History  Procedure Laterality Date  . Knee surgery Left 2011  . Eye surgery  2011, 2012  . Foot surgery Left   . Ankle surgery Left   . Hand surgery Left   . Dilation and curettage, diagnostic / therapeutic     Family History  Problem Relation Age of Onset  . Heart attack Father   . Leukemia Mother   . Heart Problems Brother   . Other Brother     kidney problems  . Diabetes Mellitus II Sister    Social History  Substance Use Topics  . Smoking status: Never Smoker   . Smokeless tobacco: None  . Alcohol Use: 0.0 oz/week    0 Standard drinks or equivalent per week     Comment: very rare (wine at christmas)   OB History    No data available     Review of Systems  Eyes: Positive for visual disturbance.  Respiratory: Negative for shortness of breath.   Cardiovascular: Positive for palpitations. Negative for chest pain.  Gastrointestinal: Positive for diarrhea. Negative for nausea and vomiting.  Skin:        + Abrasion  Neurological: Positive for light-headedness. Negative for headaches.  All other systems reviewed and are negative.     Allergies  Aspirin; Saccharin; and Soap  Home Medications   Prior to Admission medications   Medication Sig Start Date End Date Taking? Authorizing Provider  acarbose (PRECOSE) 25 MG tablet Take 25 mg by mouth 3 (three) times daily with meals.  01/09/15  Yes Historical Provider, MD  amLODipine (NORVASC) 5 MG tablet Take 2.5 mg by mouth 2 (two) times daily.  12/23/09  Yes Historical Provider, MD  aspirin EC 81 MG tablet Take 81 mg by mouth daily.   Yes Historical Provider, MD  calcium-vitamin D (OSCAL WITH D) 500-200 MG-UNIT per tablet Take 1 tablet by mouth 2 (two) times daily.   Yes Historical Provider, MD  Cyanocobalamin (VITAMIN B-12 SL) Place 1 tablet under the tongue daily.   Yes Historical Provider, MD  ezetimibe-simvastatin (VYTORIN) 10-10 MG per tablet Take 1 tablet by mouth at bedtime.   Yes Historical Provider, MD  ferrous sulfate 325 (65 FE) MG tablet Take 325 mg by mouth 2 (two) times daily with a meal.   Yes Historical Provider, MD  insulin detemir (LEVEMIR) 100 UNIT/ML injection Inject 18 Units into the skin every morning.   Yes Historical Provider, MD  lisinopril (PRINIVIL,ZESTRIL) 20  MG tablet Take 20 mg by mouth 2 (two) times daily.   Yes Historical Provider, MD  MAGNESIUM PO Take 1 tablet by mouth daily.   Yes Historical Provider, MD  metFORMIN (GLUCOPHAGE) 1000 MG tablet Take 1,000 mg by mouth 2 (two) times daily with a meal.  09/01/09  Yes Historical Provider, MD  mirabegron ER (MYRBETRIQ) 50 MG TB24 tablet Take 50 mg by mouth daily.   Yes Historical Provider, MD  moxifloxacin (VIGAMOX) 0.5 % ophthalmic solution Place 1 drop into the right eye 4 (four) times daily.   Yes Historical Provider, MD  naproxen sodium (ANAPROX) 220 MG tablet Take 220 mg by mouth 2 (two) times daily as needed (pain).   Yes Historical Provider, MD  NEXIUM 40 MG  capsule Take 40 mg by mouth every other day.  01/09/15  Yes Historical Provider, MD  Potassium Gluconate 595 MG CAPS Take 1 capsule by mouth 2 (two) times daily.   Yes Historical Provider, MD  prednisoLONE acetate (PRED FORTE) 1 % ophthalmic suspension Place 1 drop into the right eye 4 (four) times daily.  05/26/10  Yes Historical Provider, MD  tamsulosin (FLOMAX) 0.4 MG CAPS capsule Take 0.4 mg by mouth daily.   Yes Historical Provider, MD  traZODone (DESYREL) 50 MG tablet Take 50 mg by mouth at bedtime.   Yes Historical Provider, MD  vitamin C (ASCORBIC ACID) 500 MG tablet Take 500 mg by mouth daily.   Yes Historical Provider, MD   BP 114/50 mmHg  Pulse 75  Temp(Src) 98 F (36.7 C) (Oral)  Resp 18  Ht 5' (1.524 m)  Wt 137 lb (62.143 kg)  BMI 26.76 kg/m2  SpO2 100% Physical Exam  Constitutional: She is oriented to person, place, and time. She appears well-developed and well-nourished. No distress.  HENT:  Head: Normocephalic and atraumatic.  Right Ear: External ear normal.  Left Ear: External ear normal.  Nose: Nose normal.  Mouth/Throat: Oropharynx is clear and moist. No oropharyngeal exudate.  Eyes: Conjunctivae and EOM are normal. Pupils are equal, round, and reactive to light.  Neck: Normal range of motion. Neck supple.  Cardiovascular: Normal heart sounds and intact distal pulses.  An irregularly irregular rhythm present. Tachycardia present.   Pulmonary/Chest: Effort normal and breath sounds normal. No respiratory distress.  Abdominal: Soft. There is no tenderness.  Musculoskeletal: Normal range of motion.  Neurological: She is alert and oriented to person, place, and time. She has normal strength. No cranial nerve deficit. Gait normal. GCS eye subscore is 4. GCS verbal subscore is 5. GCS motor subscore is 6.  Sensation grossly intact.  No pronator drift.  Bilateral heel-knee-shin intact.  Skin: Skin is warm and dry. She is not diaphoretic.  Nursing note and vitals  reviewed.   ED Course  Procedures (including critical care time) Medications  aspirin EC tablet 81 mg (81 mg Oral Given 04/01/15 0923)  calcium-vitamin D (OSCAL WITH D) 500-200 MG-UNIT per tablet 1 tablet (1 tablet Oral Given 04/01/15 0923)  ferrous sulfate tablet 325 mg (325 mg Oral Given 04/01/15 1729)  insulin detemir (LEVEMIR) injection 18 Units (18 Units Subcutaneous Given 04/01/15 0920)  magnesium oxide (MAG-OX) tablet 200 mg (200 mg Oral Given 04/01/15 0919)  lisinopril (PRINIVIL,ZESTRIL) tablet 20 mg (20 mg Oral Given 04/01/15 0920)  mirabegron ER (MYRBETRIQ) tablet 50 mg (50 mg Oral Given 04/01/15 0919)  gatifloxacin (ZYMAXID) 0.5 % ophthalmic drops 1 drop (1 drop Right Eye Given 04/01/15 1728)  tamsulosin (FLOMAX) capsule 0.4 mg (0.4  mg Oral Given 04/01/15 0935)  traZODone (DESYREL) tablet 50 mg (not administered)  vitamin C (ASCORBIC ACID) tablet 500 mg (500 mg Oral Given 04/01/15 0918)  acarbose (PRECOSE) tablet 25 mg (25 mg Oral Given 04/01/15 1729)  metFORMIN (GLUCOPHAGE) tablet 1,000 mg (1,000 mg Oral Given 04/01/15 1729)  pantoprazole (PROTONIX) EC tablet 80 mg (80 mg Oral Given 04/01/15 1143)  prednisoLONE acetate (PRED FORTE) 1 % ophthalmic suspension 1 drop (1 drop Right Eye Given 04/01/15 1729)  insulin aspart (novoLOG) injection 0-15 Units (0 Units Subcutaneous Not Given 04/01/15 1723)  ezetimibe (ZETIA) tablet 10 mg (not administered)  simvastatin (ZOCOR) tablet 10 mg (not administered)  naproxen (NAPROSYN) tablet 250 mg (not administered)  potassium chloride SA (K-DUR,KLOR-CON) CR tablet 20 mEq (20 mEq Oral Given 04/01/15 0919)  heparin injection 5,000 Units (5,000 Units Subcutaneous Given 04/01/15 1426)  metoprolol tartrate (LOPRESSOR) tablet 12.5 mg (12.5 mg Oral Given 04/01/15 1143)  magnesium sulfate IVPB 2 g 50 mL (0 g Intravenous Stopped 04/01/15 0157)  potassium chloride SA (K-DUR,KLOR-CON) CR tablet 60 mEq (60 mEq Oral Given 04/01/15 0051)  magnesium sulfate IVPB 2 g 50 mL (2 g Intravenous Given  04/01/15 0504)  potassium chloride SA (K-DUR,KLOR-CON) CR tablet 40 mEq (40 mEq Oral Given 04/01/15 0551)    Labs Review Labs Reviewed  CBC WITH DIFFERENTIAL/PLATELET - Abnormal; Notable for the following:    Eosinophils Relative 8 (*)    All other components within normal limits  COMPREHENSIVE METABOLIC PANEL - Abnormal; Notable for the following:    Potassium 3.0 (*)    Chloride 95 (*)    Glucose, Bld 138 (*)    Albumin 3.4 (*)    ALT 13 (*)    GFR calc non Af Amer 58 (*)    All other components within normal limits  URINALYSIS, ROUTINE W REFLEX MICROSCOPIC (NOT AT [facility]) - Abnormal; Notable for the following:    APPearance CLOUDY (*)    Leukocytes, UA TRACE (*)    All other components within normal limits  MAGNESIUM - Abnormal; Notable for the following:    Magnesium 0.9 (*)    All other components within normal limits  URINE MICROSCOPIC-ADD ON - Abnormal; Notable for the following:    Squamous Epithelial / LPF FEW (*)    All other components within normal limits  MAGNESIUM - Abnormal; Notable for the following:    Magnesium 1.4 (*)    All other components within normal limits  T4, FREE - Abnormal; Notable for the following:    Free T4 1.29 (*)    All other components within normal limits  BASIC METABOLIC PANEL - Abnormal; Notable for the following:    Potassium 3.4 (*)    Chloride 97 (*)    Glucose, Bld 102 (*)    All other components within normal limits  CBC - Abnormal; Notable for the following:    Hemoglobin 11.7 (*)    HCT 35.8 (*)    All other components within normal limits  LIPID PANEL - Abnormal; Notable for the following:    HDL 37 (*)    All other components within normal limits  GLUCOSE, CAPILLARY - Abnormal; Notable for the following:    Glucose-Capillary 140 (*)    All other components within normal limits  GLUCOSE, CAPILLARY - Abnormal; Notable for the following:    Glucose-Capillary 105 (*)    All other components within normal limits  CBG MONITORING,  ED - Abnormal; Notable for the following:  Glucose-Capillary 138 (*)    All other components within normal limits  TROPONIN I  PROTIME-INR  TSH  BRAIN NATRIURETIC PEPTIDE  TROPONIN I  TROPONIN I  TROPONIN I  BRAIN NATRIURETIC PEPTIDE  PROTIME-INR  GLUCOSE, CAPILLARY  MAGNESIUM  POTASSIUM  GLUCOSE, CAPILLARY  HEMOGLOBIN A1C  BASIC METABOLIC PANEL  MAGNESIUM    Imaging Review Dg Chest 2 View  04/01/2015   CLINICAL DATA:  81 year old female with dizziness and fall. No chest complaints.  EXAM: CHEST  2 VIEW  COMPARISON:  Radiograph dated 01/06/2009  FINDINGS: The heart size and mediastinal contours are within normal limits. Both lungs are clear. Osteopenia no acute fracture.  IMPRESSION: No active cardiopulmonary disease.   Electronically Signed   By: Elgie Collard M.D.   On: 04/01/2015 00:09   Ct Head Wo Contrast  04/01/2015   CLINICAL DATA:  Multiple falls today with dizziness in generalize weakness.  EXAM: CT HEAD WITHOUT CONTRAST  TECHNIQUE: Contiguous axial images were obtained from the base of the skull through the vertex without intravenous contrast.  COMPARISON:  None.  FINDINGS: Mild diffuse cerebral atrophy. Low-attenuation changes throughout the deep white matter consistent with small vessel ischemia. Old lacunar infarcts in the left thalamus. No mass effect or midline shift. No abnormal extra-axial fluid collections. Gray-white matter junctions are distinct. Basal cisterns are not effaced. No evidence of acute intracranial hemorrhage. No depressed skull fractures. Visualized paranasal sinuses and mastoid air cells are not opacified. Vascular calcifications.  IMPRESSION: No acute intracranial abnormalities. Chronic atrophy and small vessel ischemic changes.   Electronically Signed   By: Burman Nieves M.D.   On: 04/01/2015 00:17   I have personally reviewed and evaluated these images and lab results as part of my medical decision-making.   EKG Interpretation   Date/Time:   Monday March 31 2015 22:23:11 EDT Ventricular Rate:  108 PR Interval:  152 QRS Duration: 121 QT Interval:  347 QTC Calculation: 465 R Axis:   49 Text Interpretation:  Multifocal atrial tachycardia Right bundle branch  block New compared to prior Reconfirmed by LIU MD, DANA [phone]) on  04/01/2015 2:44:15 AM      12:33 AM Discussed with Dr. Eldridge Dace of cardiology who will consult on patient.   MDM   Final diagnoses:  Multifocal atrial tachycardia  Hypokalemia  Hypomagnesemia  Falls, initial encounter    Filed Vitals:   04/01/15 2024  BP: 114/50  Pulse: 75  Temp: 98 F (36.7 C)  Resp: 18   Afebrile, NAD, non-toxic appearing, AAOx4.    Patient presenting with 6 episodes episodes of lightheadedness, palpitations with fall without syncope. No neurofocal deficits on examination. EKG well in room appears to be new onset A. fib, irregularly irregular rhythm appreciated on examination. CT scan obtained to ensure no head injury with falls. CT scan is unremarkable. Patient noted to be hypo-magnesic, will replace, likely cause of irregular regular rhythm. Potassium also reviewed placed as well. Cardiology was consultative. Patient will be admitted to the hospitalist for further management and evaluation. Patient d/w with Dr. Verdie Mosher, agrees with plan.     Francee Piccolo, PA-C 04/01/15 2052  Lavera Guise, MD 04/02/15 1246  Lavera Guise, MD 04/02/15 [phone]

## 2015-03-31 NOTE — ED Notes (Signed)
Pt reports feeling like she is "swimmy headed" and everything is spinning before she falls, pt states she cannot speak and loses sight before she falls.  Family states she is like this for a few minutes and it happened 6x today.

## 2015-03-31 NOTE — ED Notes (Signed)
Pa  at bedside. 

## 2015-03-31 NOTE — ED Notes (Signed)
Pt. reports multiple falls today with dizziness and generalized weakness , denies pain or discomfort , respirations unlabored / alert and oriented , presents with superficial abrasions at left forearm.

## 2015-04-01 ENCOUNTER — Inpatient Hospital Stay (HOSPITAL_COMMUNITY): Payer: Medicare Other

## 2015-04-01 DIAGNOSIS — Z833 Family history of diabetes mellitus: Secondary | ICD-10-CM | POA: Diagnosis not present

## 2015-04-01 DIAGNOSIS — R296 Repeated falls: Secondary | ICD-10-CM | POA: Diagnosis present

## 2015-04-01 DIAGNOSIS — Z8249 Family history of ischemic heart disease and other diseases of the circulatory system: Secondary | ICD-10-CM | POA: Diagnosis not present

## 2015-04-01 DIAGNOSIS — I10 Essential (primary) hypertension: Secondary | ICD-10-CM | POA: Diagnosis not present

## 2015-04-01 DIAGNOSIS — I471 Supraventricular tachycardia: Secondary | ICD-10-CM | POA: Diagnosis present

## 2015-04-01 DIAGNOSIS — Z806 Family history of leukemia: Secondary | ICD-10-CM | POA: Diagnosis not present

## 2015-04-01 DIAGNOSIS — I4891 Unspecified atrial fibrillation: Secondary | ICD-10-CM

## 2015-04-01 DIAGNOSIS — I5022 Chronic systolic (congestive) heart failure: Secondary | ICD-10-CM | POA: Diagnosis present

## 2015-04-01 DIAGNOSIS — I509 Heart failure, unspecified: Secondary | ICD-10-CM | POA: Insufficient documentation

## 2015-04-01 DIAGNOSIS — I4719 Other supraventricular tachycardia: Secondary | ICD-10-CM | POA: Diagnosis present

## 2015-04-01 DIAGNOSIS — I5042 Chronic combined systolic (congestive) and diastolic (congestive) heart failure: Secondary | ICD-10-CM | POA: Diagnosis not present

## 2015-04-01 DIAGNOSIS — E785 Hyperlipidemia, unspecified: Secondary | ICD-10-CM | POA: Diagnosis present

## 2015-04-01 DIAGNOSIS — R55 Syncope and collapse: Secondary | ICD-10-CM

## 2015-04-01 DIAGNOSIS — R002 Palpitations: Secondary | ICD-10-CM | POA: Diagnosis present

## 2015-04-01 DIAGNOSIS — E876 Hypokalemia: Secondary | ICD-10-CM

## 2015-04-01 DIAGNOSIS — E119 Type 2 diabetes mellitus without complications: Secondary | ICD-10-CM

## 2015-04-01 DIAGNOSIS — M810 Age-related osteoporosis without current pathological fracture: Secondary | ICD-10-CM | POA: Diagnosis present

## 2015-04-01 DIAGNOSIS — I2510 Atherosclerotic heart disease of native coronary artery without angina pectoris: Secondary | ICD-10-CM | POA: Diagnosis present

## 2015-04-01 LAB — TROPONIN I: Troponin I: <0.03 ng/mL (ref <0.031)

## 2015-04-01 LAB — BASIC METABOLIC PANEL
Anion gap: 8 (ref 5–15)
BUN: 9 mg/dL (ref 6–20)
CO2: 32 mmol/L (ref 22–32)
CREATININE: 0.82 mg/dL (ref 0.44–1.00)
Calcium: 9.4 mg/dL (ref 8.9–10.3)
Chloride: 97 mmol/L — ABNORMAL LOW (ref 101–111)
GFR calc Af Amer: >60 mL/min (ref >60)
Glucose, Bld: 102 mg/dL — ABNORMAL HIGH (ref 65–99)
Potassium: 3.4 mmol/L — ABNORMAL LOW (ref 3.5–5.1)
SODIUM: 137 mmol/L (ref 135–145)

## 2015-04-01 LAB — PROTIME-INR
INR: 1.11 (ref 0.00–1.49)
Prothrombin Time: 14.5 seconds (ref 11.6–15.2)

## 2015-04-01 LAB — CBC
HCT: 35.8 % — ABNORMAL LOW (ref 36.0–46.0)
Hemoglobin: 11.7 g/dL — ABNORMAL LOW (ref 12.0–15.0)
MCH: 28.1 pg (ref 26.0–34.0)
MCHC: 32.7 g/dL (ref 30.0–36.0)
MCV: 85.9 fL (ref 78.0–100.0)
PLATELETS: 234 10*3/uL (ref 150–400)
RBC: 4.17 MIL/uL (ref 3.87–5.11)
RDW: 13.9 % (ref 11.5–15.5)
WBC: 6.6 10*3/uL (ref 4.0–10.5)

## 2015-04-01 LAB — LIPID PANEL
CHOLESTEROL: 111 mg/dL (ref 0–200)
HDL: 37 mg/dL — ABNORMAL LOW (ref >40)
LDL Cholesterol: 57 mg/dL (ref 0–99)
Total CHOL/HDL Ratio: 3 RATIO
Triglycerides: 84 mg/dL (ref <150)
VLDL: 17 mg/dL (ref 0–40)

## 2015-04-01 LAB — GLUCOSE, CAPILLARY
GLUCOSE-CAPILLARY: 98 mg/dL (ref 65–99)
GLUCOSE-CAPILLARY: 97 mg/dL (ref 65–99)
Glucose-Capillary: 140 mg/dL — ABNORMAL HIGH (ref 65–99)
Glucose-Capillary: 105 mg/dL — ABNORMAL HIGH (ref 65–99)

## 2015-04-01 LAB — MAGNESIUM
MAGNESIUM: 1.8 mg/dL (ref 1.7–2.4)
Magnesium: 0.9 mg/dL — CL (ref 1.7–2.4)
Magnesium: 1.4 mg/dL — ABNORMAL LOW (ref 1.7–2.4)

## 2015-04-01 LAB — TSH: TSH: 1.923 u[IU]/mL (ref 0.350–4.500)

## 2015-04-01 LAB — T4, FREE: Free T4: 1.29 ng/dL — ABNORMAL HIGH (ref 0.61–1.12)

## 2015-04-01 LAB — BRAIN NATRIURETIC PEPTIDE
B NATRIURETIC PEPTIDE 5: 99.5 pg/mL (ref 0.0–100.0)
B Natriuretic Peptide: 85.7 pg/mL (ref 0.0–100.0)

## 2015-04-01 LAB — POTASSIUM: POTASSIUM: 4 mmol/L (ref 3.5–5.1)

## 2015-04-01 MED ORDER — NAPROXEN SODIUM 275 MG PO TABS
275.0000 mg | ORAL_TABLET | Freq: Two times a day (BID) | ORAL | Status: DC | PRN
  Filled 2015-04-01: qty 1

## 2015-04-01 MED ORDER — METOPROLOL TARTRATE 12.5 MG HALF TABLET
12.5000 mg | ORAL_TABLET | Freq: Two times a day (BID) | ORAL | Status: DC
  Administered 2015-04-01 – 2015-04-03 (×5): 12.5 mg via ORAL
  Filled 2015-04-01 (×5): qty 1

## 2015-04-01 MED ORDER — MAGNESIUM OXIDE 400 (241.3 MG) MG PO TABS
200.0000 mg | ORAL_TABLET | Freq: Every day | ORAL | Status: DC
  Administered 2015-04-01 – 2015-04-03 (×3): 200 mg via ORAL
  Filled 2015-04-01: qty 0.5
  Filled 2015-04-01 (×2): qty 1
  Filled 2015-04-01: qty 0.5
  Filled 2015-04-01: qty 1
  Filled 2015-04-01: qty 0.5

## 2015-04-01 MED ORDER — EZETIMIBE-SIMVASTATIN 10-10 MG PO TABS: 1.0000 | ORAL_TABLET | Freq: Every day | ORAL | Status: DC

## 2015-04-01 MED ORDER — NAPROXEN 250 MG PO TABS
250.0000 mg | ORAL_TABLET | Freq: Two times a day (BID) | ORAL | Status: DC | PRN
  Filled 2015-04-01: qty 1

## 2015-04-01 MED ORDER — TRAZODONE HCL 50 MG PO TABS
50.0000 mg | ORAL_TABLET | Freq: Every day | ORAL | Status: DC
  Administered 2015-04-01 – 2015-04-02 (×2): 50 mg via ORAL
  Filled 2015-04-01 (×2): qty 1

## 2015-04-01 MED ORDER — ACARBOSE 25 MG PO TABS
25.0000 mg | ORAL_TABLET | Freq: Three times a day (TID) | ORAL | Status: DC
  Administered 2015-04-01 – 2015-04-03 (×7): 25 mg via ORAL
  Filled 2015-04-01 (×11): qty 1

## 2015-04-01 MED ORDER — INSULIN ASPART 100 UNIT/ML ~~LOC~~ SOLN
0.0000 [IU] | SUBCUTANEOUS | Status: DC
  Administered 2015-04-01: 2 [IU] via SUBCUTANEOUS
  Administered 2015-04-02: 3 [IU] via SUBCUTANEOUS

## 2015-04-01 MED ORDER — METFORMIN HCL 500 MG PO TABS
1000.0000 mg | ORAL_TABLET | Freq: Two times a day (BID) | ORAL | Status: DC
Start: 2015-04-01 — End: 2015-04-03
  Administered 2015-04-01 – 2015-04-03 (×5): 1000 mg via ORAL
  Filled 2015-04-01 (×5): qty 2

## 2015-04-01 MED ORDER — EZETIMIBE 10 MG PO TABS
10.0000 mg | ORAL_TABLET | Freq: Every day | ORAL | Status: DC
  Administered 2015-04-01 – 2015-04-02 (×2): 10 mg via ORAL
  Filled 2015-04-01 (×2): qty 1

## 2015-04-01 MED ORDER — GATIFLOXACIN 0.5 % OP SOLN
1.0000 [drp] | Freq: Four times a day (QID) | OPHTHALMIC | Status: DC
  Administered 2015-04-01 – 2015-04-02 (×6): 1 [drp] via OPHTHALMIC
  Filled 2015-04-01 (×2): qty 2.5

## 2015-04-01 MED ORDER — ENOXAPARIN SODIUM 60 MG/0.6ML ~~LOC~~ SOLN
1.0000 mg/kg | Freq: Two times a day (BID) | SUBCUTANEOUS | Status: DC
  Administered 2015-04-01: 60 mg via SUBCUTANEOUS
  Filled 2015-04-01 (×2): qty 0.6

## 2015-04-01 MED ORDER — AMLODIPINE BESYLATE 2.5 MG PO TABS
2.5000 mg | ORAL_TABLET | Freq: Two times a day (BID) | ORAL | Status: DC
  Administered 2015-04-01 (×2): 2.5 mg via ORAL
  Filled 2015-04-01 (×2): qty 1

## 2015-04-01 MED ORDER — MAGNESIUM SULFATE 2 GM/50ML IV SOLN
2.0000 g | Freq: Once | INTRAVENOUS | Status: AC
  Administered 2015-04-01: 2 g via INTRAVENOUS
  Filled 2015-04-01: qty 50

## 2015-04-01 MED ORDER — TAMSULOSIN HCL 0.4 MG PO CAPS
0.4000 mg | ORAL_CAPSULE | Freq: Every day | ORAL | Status: DC
  Administered 2015-04-01 – 2015-04-03 (×3): 0.4 mg via ORAL
  Filled 2015-04-01 (×3): qty 1

## 2015-04-01 MED ORDER — POTASSIUM CHLORIDE CRYS ER 20 MEQ PO TBCR
40.0000 meq | EXTENDED_RELEASE_TABLET | Freq: Once | ORAL | Status: AC
  Administered 2015-04-01: 40 meq via ORAL
  Filled 2015-04-01: qty 2

## 2015-04-01 MED ORDER — CALCIUM CARBONATE-VITAMIN D 500-200 MG-UNIT PO TABS
1.0000 | ORAL_TABLET | Freq: Two times a day (BID) | ORAL | Status: DC
  Administered 2015-04-01 – 2015-04-03 (×5): 1 via ORAL
  Filled 2015-04-01 (×5): qty 1

## 2015-04-01 MED ORDER — VITAMIN C 500 MG PO TABS
500.0000 mg | ORAL_TABLET | Freq: Every day | ORAL | Status: DC
Start: 2015-04-01 — End: 2015-04-03
  Administered 2015-04-01 – 2015-04-03 (×3): 500 mg via ORAL
  Filled 2015-04-01 (×3): qty 1

## 2015-04-01 MED ORDER — POTASSIUM CHLORIDE CRYS ER 20 MEQ PO TBCR
20.0000 meq | EXTENDED_RELEASE_TABLET | Freq: Every day | ORAL | Status: DC
  Administered 2015-04-01 – 2015-04-03 (×3): 20 meq via ORAL
  Filled 2015-04-01 (×3): qty 1

## 2015-04-01 MED ORDER — FERROUS SULFATE 325 (65 FE) MG PO TABS
325.0000 mg | ORAL_TABLET | Freq: Two times a day (BID) | ORAL | Status: DC
  Administered 2015-04-01 – 2015-04-03 (×5): 325 mg via ORAL
  Filled 2015-04-01 (×5): qty 1

## 2015-04-01 MED ORDER — INSULIN DETEMIR 100 UNIT/ML ~~LOC~~ SOLN
18.0000 [IU] | Freq: Every day | SUBCUTANEOUS | Status: DC
  Administered 2015-04-01 – 2015-04-02 (×2): 18 [IU] via SUBCUTANEOUS
  Filled 2015-04-01 (×3): qty 0.18

## 2015-04-01 MED ORDER — POTASSIUM GLUCONATE 595 MG PO CAPS: 1.0000 | ORAL_CAPSULE | Freq: Two times a day (BID) | ORAL | Status: DC

## 2015-04-01 MED ORDER — HEPARIN SODIUM (PORCINE) 5000 UNIT/ML IJ SOLN
5000.0000 [IU] | Freq: Three times a day (TID) | INTRAMUSCULAR | Status: DC
  Administered 2015-04-01 – 2015-04-03 (×6): 5000 [IU] via SUBCUTANEOUS
  Filled 2015-04-01 (×6): qty 1

## 2015-04-01 MED ORDER — PREDNISOLONE ACETATE 1 % OP SUSP
1.0000 [drp] | Freq: Four times a day (QID) | OPHTHALMIC | Status: DC
  Administered 2015-04-01 – 2015-04-03 (×8): 1 [drp] via OPHTHALMIC
  Filled 2015-04-01 (×2): qty 1

## 2015-04-01 MED ORDER — PANTOPRAZOLE SODIUM 40 MG PO TBEC
80.0000 mg | DELAYED_RELEASE_TABLET | Freq: Every day | ORAL | Status: DC
  Administered 2015-04-01 – 2015-04-03 (×3): 80 mg via ORAL
  Filled 2015-04-01 (×3): qty 2

## 2015-04-01 MED ORDER — ASPIRIN EC 81 MG PO TBEC
81.0000 mg | DELAYED_RELEASE_TABLET | Freq: Every day | ORAL | Status: DC
  Administered 2015-04-01 – 2015-04-03 (×3): 81 mg via ORAL
  Filled 2015-04-01 (×3): qty 1

## 2015-04-01 MED ORDER — SIMVASTATIN 10 MG PO TABS
10.0000 mg | ORAL_TABLET | Freq: Every day | ORAL | Status: DC
  Administered 2015-04-01 – 2015-04-02 (×2): 10 mg via ORAL
  Filled 2015-04-01 (×3): qty 1

## 2015-04-01 MED ORDER — LISINOPRIL 20 MG PO TABS
20.0000 mg | ORAL_TABLET | Freq: Two times a day (BID) | ORAL | Status: DC
  Administered 2015-04-01 – 2015-04-03 (×5): 20 mg via ORAL
  Filled 2015-04-01 (×5): qty 1

## 2015-04-01 MED ORDER — MIRABEGRON ER 50 MG PO TB24
50.0000 mg | ORAL_TABLET | Freq: Every day | ORAL | Status: DC
  Administered 2015-04-01 – 2015-04-02 (×2): 50 mg via ORAL
  Administered 2015-04-03: 25 mg via ORAL
  Filled 2015-04-01 (×3): qty 1

## 2015-04-01 MED ORDER — POTASSIUM CHLORIDE CRYS ER 20 MEQ PO TBCR
60.0000 meq | EXTENDED_RELEASE_TABLET | Freq: Once | ORAL | Status: AC
  Administered 2015-04-01: 60 meq via ORAL
  Filled 2015-04-01: qty 3

## 2015-04-01 NOTE — Progress Notes (Signed)
ANTICOAGULATION CONSULT NOTE - Initial Consult  Pharmacy Consult for Lovenox  Indication: Afib vs MAT  Allergies  Allergen Reactions  . Aspirin Other (See Comments)    Other Reaction: mouth blisters  . Saccharin Other (See Comments)    Other Reaction: oral blisters with art. sweetn  . Soap Other (See Comments)    Other Reaction: dial-drys skin    Patient Measurements: Height: 5' (152.4 cm) Weight: 137 lb (62.143 kg) IBW/kg (Calculated) : 45.5  Vital Signs: Temp: 97.9 F (36.6 C) (09/06 0310) Temp Source: Oral (09/06 0310) BP: 153/74 mmHg (09/06 0310) Pulse Rate: 71 (09/06 0310)  Labs:  Recent Labs  03/31/15 2258 04/01/15 0317  HGB 12.5  --   HCT 38.5  --   PLT 261  --   LABPROT 13.8  --   INR 1.04  --   CREATININE 0.90  --   TROPONINI  --  <0.03    Estimated Creatinine Clearance: 39.6 mL/min (by C-G formula based on Cr of 0.9).   Medical History: Past Medical History  Diagnosis Date  . DM2 (diabetes mellitus, type 2)   . HTN (hypertension)   . CHF (congestive heart failure)   . Headache   . Cataract   . Osteoporosis     Assessment: 81 y/o F to start full dose Lovenox for afib vs multifocal atrial tachycardia(MAT). CBC/renal function good. Other labs as above.   Goal of Therapy:  Monitor platelets by anticoagulation protocol: Yes   Plan:  -Lovenox 60 mg subcutaneous q12h -Minimum q72h CBC while inpatient -Monitor for bleeding -?Is Lovenox needed if afib is ruled out in favor of MAT   Jocelyn Schultz 04/01/2015,4:13 AM

## 2015-04-01 NOTE — Progress Notes (Signed)
  Echocardiogram 2D Echocardiogram has been performed.  Jocelyn Schultz 04/01/2015, 2:41 PM

## 2015-04-01 NOTE — Progress Notes (Signed)
  Echocardiogram 2D Echocardiogram has been performed.  Arvil Chaco 04/01/2015, 2:09 PM

## 2015-04-01 NOTE — Progress Notes (Signed)
Transfer from ed, via stretcher accompanied by transport tech and daughter.  Patient alert and oriented x 4, no complaints of chest pains or shortness of breath.  Patient hooked to telemetry.  Vss.  Oriented to unit's policies and routines.  Discussed initial plan of care.  Bed alarm activated.  Will monitor at intervals.  Call light placed within reach.

## 2015-04-01 NOTE — ED Notes (Signed)
Transporting patient to new room assignment. 

## 2015-04-01 NOTE — Progress Notes (Signed)
Utilization review completed. Yvon Mccord, RN, BSN. 

## 2015-04-01 NOTE — H&P (Signed)
[facility] History and Physical  LIEN LYMAN ZOX:[id number] DOB: [birth year] DOA: 03/31/2015  Referring physician: Francee Piccolo PA PCP: Ailene Ravel, MD   Chief Complaint: fall  HPI: Jocelyn Schultz is a 81 y.o. female with history of CHF CAD HTN HLD DM2 presents to the ED with a episode of falling repeatedly. Patient reports that she has fallen at least 6 times. She has been experiencing dizziness with the falling episodes. She also admits that she may have passed out. She has felt a little woozy also. She has no current chest pain noted. She has been on supplementation with postassium and magnesium as she has had prior issues with hypokalemia and hypomagnesemia. In the ED her potassium was noted to be low as well as Mag. Seen in the ED by cardiology who feels that the primary problem is multifocal atrial tachycardia   Review of Systems:  Complete systems reviewed and are unremarkable other than HPI.   Past Medical History  Diagnosis Date  . DM2 (diabetes mellitus, type 2)   . HTN (hypertension)   . CHF (congestive heart failure)   . Headache   . Cataract   . Osteoporosis    Past Surgical History  Procedure Laterality Date  . Knee surgery Left 2011  . Eye surgery  2011, 2012  . Foot surgery Left   . Ankle surgery Left   . Hand surgery Left   . Dilation and curettage, diagnostic / therapeutic     Social History:  reports that she has never smoked. She does not have any smokeless tobacco history on file. She reports that she drinks alcohol. She reports that she uses illicit drugs about 5 times per week.  Allergies  Allergen Reactions  . Aspirin Other (See Comments)    Other Reaction: mouth blisters  . Saccharin Other (See Comments)    Other Reaction: oral blisters with art. sweetn  . Soap Other (See Comments)    Other Reaction: dial-drys skin    Family History  Problem Relation Age of Onset  . Heart attack Father   . Leukemia Mother   . Heart  Problems Brother   . Other Brother     kidney problems  . Diabetes Mellitus II Sister      Prior to Admission medications   Medication Sig Start Date End Date Taking? Authorizing Provider  acarbose (PRECOSE) 25 MG tablet Take 25 mg by mouth 3 (three) times daily with meals.  01/09/15  Yes Historical Provider, MD  amLODipine (NORVASC) 5 MG tablet Take 2.5 mg by mouth 2 (two) times daily.  12/23/09  Yes Historical Provider, MD  aspirin EC 81 MG tablet Take 81 mg by mouth daily.   Yes Historical Provider, MD  calcium-vitamin D (OSCAL WITH D) 500-200 MG-UNIT per tablet Take 1 tablet by mouth 2 (two) times daily.   Yes Historical Provider, MD  Cyanocobalamin (VITAMIN B-12 SL) Place 1 tablet under the tongue daily.   Yes Historical Provider, MD  ezetimibe-simvastatin (VYTORIN) 10-10 MG per tablet Take 1 tablet by mouth at bedtime.   Yes Historical Provider, MD  ferrous sulfate 325 (65 FE) MG tablet Take 325 mg by mouth 2 (two) times daily with a meal.   Yes Historical Provider, MD  insulin detemir (LEVEMIR) 100 UNIT/ML injection Inject 18 Units into the skin every morning.   Yes Historical Provider, MD  lisinopril (PRINIVIL,ZESTRIL) 20 MG tablet Take 20 mg by mouth 2 (two) times daily.   Yes Historical Provider, MD  MAGNESIUM PO Take 1 tablet by mouth daily.   Yes Historical Provider, MD  metFORMIN (GLUCOPHAGE) 1000 MG tablet Take 1,000 mg by mouth 2 (two) times daily with a meal.  09/01/09  Yes Historical Provider, MD  mirabegron ER (MYRBETRIQ) 50 MG TB24 tablet Take 50 mg by mouth daily.   Yes Historical Provider, MD  moxifloxacin (VIGAMOX) 0.5 % ophthalmic solution Place 1 drop into the right eye 4 (four) times daily.   Yes Historical Provider, MD  naproxen sodium (ANAPROX) 220 MG tablet Take 220 mg by mouth 2 (two) times daily as needed (pain).   Yes Historical Provider, MD  NEXIUM 40 MG capsule Take 40 mg by mouth every other day.  01/09/15  Yes Historical Provider, MD  Potassium Gluconate 595 MG  CAPS Take 1 capsule by mouth 2 (two) times daily.   Yes Historical Provider, MD  prednisoLONE acetate (PRED FORTE) 1 % ophthalmic suspension Place 1 drop into the right eye 4 (four) times daily.  05/26/10  Yes Historical Provider, MD  tamsulosin (FLOMAX) 0.4 MG CAPS capsule Take 0.4 mg by mouth daily.   Yes Historical Provider, MD  traZODone (DESYREL) 50 MG tablet Take 50 mg by mouth at bedtime.   Yes Historical Provider, MD  vitamin C (ASCORBIC ACID) 500 MG tablet Take 500 mg by mouth daily.   Yes Historical Provider, MD   Physical Exam: Filed Vitals:   03/31/15 2344 03/31/15 2345 04/01/15 0000 04/01/15 0015  BP: 144/76 144/76 164/81 153/88  Pulse: 109 106 118 55  Temp:      TempSrc:      Resp: 19 19 21 19   SpO2: 97% 92% 96% 95%    Wt Readings from Last 3 Encounters:  No data found for Wt    General:  Appears calm and comfortable Eyes: PERRL, normal lids, irises & conjunctiva ENT: grossly normal hearing, lips & tongue Neck: no LAD, masses or thyromegaly Cardiovascular: IRR, no m/r/g. No LE edema Respiratory: CTA bilaterally, no w/r/r. Normal respiratory effort. Abdomen: soft, c/o pain in the area of he hernia surgery from april Skin: no rash or induration seen on limited exam Musculoskeletal: grossly normal tone BUE/BLE Psychiatric: grossly normal mood and affect Neurologic: grossly non-focal.          Labs on Admission:  Basic Metabolic Panel:  Recent Labs Lab 03/31/15 2258  NA 138  K 3.0*  CL 95*  CO2 30  GLUCOSE 138*  BUN 10  CREATININE 0.90  CALCIUM 10.1  MG 0.9*   Liver Function Tests:  Recent Labs Lab 03/31/15 2258  AST 21  ALT 13*  ALKPHOS 57  BILITOT 0.6  PROT 7.7  ALBUMIN 3.4*   No results for input(s): LIPASE, AMYLASE in the last 168 hours. No results for input(s): AMMONIA in the last 168 hours. CBC:  Recent Labs Lab 03/31/15 2258  WBC 7.6  NEUTROABS 4.5  HGB 12.5  HCT 38.5  MCV 86.7  PLT 261   Cardiac Enzymes: No results for  input(s): CKTOTAL, CKMB, CKMBINDEX, TROPONINI in the last 168 hours.  BNP (last 3 results) No results for input(s): BNP in the last 8760 hours.  ProBNP (last 3 results) No results for input(s): PROBNP in the last 8760 hours.  CBG:  Recent Labs Lab 03/31/15 2040  GLUCAP 138*    Radiological Exams on Admission: Dg Chest 2 View  04/01/2015   CLINICAL DATA:  81 year old female with dizziness and fall. No chest complaints.  EXAM: CHEST  2 VIEW  COMPARISON:  Radiograph dated 01/06/2009  FINDINGS: The heart size and mediastinal contours are within normal limits. Both lungs are clear. Osteopenia no acute fracture.  IMPRESSION: No active cardiopulmonary disease.   Electronically Signed   By: Elgie Collard M.D.   On: 04/01/2015 00:09   Ct Head Wo Contrast  04/01/2015   CLINICAL DATA:  Multiple falls today with dizziness in generalize weakness.  EXAM: CT HEAD WITHOUT CONTRAST  TECHNIQUE: Contiguous axial images were obtained from the base of the skull through the vertex without intravenous contrast.  COMPARISON:  None.  FINDINGS: Mild diffuse cerebral atrophy. Low-attenuation changes throughout the deep white matter consistent with small vessel ischemia. Old lacunar infarcts in the left thalamus. No mass effect or midline shift. No abnormal extra-axial fluid collections. Gray-white matter junctions are distinct. Basal cisterns are not effaced. No evidence of acute intracranial hemorrhage. No depressed skull fractures. Visualized paranasal sinuses and mastoid air cells are not opacified. Vascular calcifications.  IMPRESSION: No acute intracranial abnormalities. Chronic atrophy and small vessel ischemic changes.   Electronically Signed   By: Burman Nieves M.D.   On: 04/01/2015 00:17      Assessment/Plan Principal Problem:   Atrial fibrillation with RVR Active Problems:   Diabetes   CHF (congestive heart failure)   HTN (hypertension)   1. MAT -this is a new problem Cardiology has seen the  patient and feels that she has MAT -will admit to step down unit -will monitor overnight on telemetry -replete mag and potassium as needed and recheck labs -will need echo possibly TEE -will also check carotid dopplers for possible TIA -on aspirin lovenox  2. Diabetes Mellitus Type II -will monitor FSBS -SSI as needed -continue levemir precose metformin ?reassess regimen  3. CHF/CAD -on ACE will continue  4. HTN -on ACE as well as flomax amlodipine -monitor pressures  5. Hyperlipidemia -will continue with vytorin     Code Status: full code (must indicate code status--if unknown or must be presumed, indicate so) DVT Prophylaxis:heparin Family Communication: none (indicate person spoken with, if applicable, with phone number if by telephone) Disposition Plan: home (indicate anticipated LOS)    [facility] Pager [phone]

## 2015-04-01 NOTE — Progress Notes (Signed)
Patient seen and examined. Admitted with syncope/presyncope. Etiology of symptoms unclear. Electrocardiogram showed sinus tachycardia with PACs and incomplete right bundle branch block. Enzymes negative. There is no documentation of atrial fibrillation. Plan echocardiogram to assess LV function. Follow on telemetry today. Supplement electrolytes including potassium and magnesium. Discontinue Norvasc. Add metoprolol 12.5 mg twice a day. Potential discharge tomorrow morning with event monitor if no significant arrhythmias on telemetry. Jocelyn Schultz

## 2015-04-01 NOTE — Progress Notes (Signed)
On rounding, pt's family at the bed side  requested to leave bed alarm off and will call RN or NT when pt need to get OOB.  Pt,s nurse Toma Copier and [place], [place] made aware.  Amanda Pea, Charity fundraiser.

## 2015-04-01 NOTE — Consult Note (Addendum)
CARDIOLOGY INPATIENT CONSULTATION  Patient ID: Jocelyn Schultz MRN: [id number], DOB/AGE: 1930-08-03   Admit date: 03/31/2015   Primary Physician: Ailene Ravel, MD Primary Cardiologist: none Ordering physician: Evelena Peat MD Ordering Team: Emergency Medicine  Reason for Consultation: new onset afib  HPI: This is a 81 y.o. female with no known history of CAD who presented with a fall and dizziness. She has known history of congestive heart failure, hypertension, diabetes mellitus, cataract, osteoprosis. She was found to have atrial fibrillation on admission. Patient was symptomatic with MAT in terms of syncope and dizziness. Her magnesium was also low at presentation. She did not feel any palpitations. Her syncope occurred when she was in standing position. She felt dizzy when standing up. On presentation to the ED, her vitals were: BP 161/85 mmHg  Pulse 106  Temp(Src) 97.4 F (36.3 C) (Oral)  Resp 18  SpO2 99% She did not have similar symptoms in the past. She also suffered from injuries on the left side. Her bilateral legs are swollen and there is redness on the left leg.   Problem List: Past Medical History  Diagnosis Date  . DM2 (diabetes mellitus, type 2)   . HTN (hypertension)   . CHF (congestive heart failure)   . Headache   . Cataract   . Osteoporosis     Past Surgical History  Procedure Laterality Date  . Knee surgery Left 2011  . Eye surgery  2011, 2012  . Foot surgery Left   . Ankle surgery Left   . Hand surgery Left   . Dilation and curettage, diagnostic / therapeutic       Allergies:  Allergies  Allergen Reactions  . Aspirin Other (See Comments)    Other Reaction: mouth blisters  . Saccharin Other (See Comments)    Other Reaction: oral blisters with art. sweetn  . Soap Other (See Comments)    Other Reaction: dial-drys skin     Home Medications Current Facility-Administered Medications  Medication Dose Route Frequency Provider Last Rate Last Dose  .  magnesium sulfate IVPB 2 g 50 mL  2 g Intravenous Once Jennifer Piepenbrink, PA-C 50 mL/hr at 04/01/15 0103 2 g at 04/01/15 0103   Current Outpatient Prescriptions  Medication Sig Dispense Refill  . acarbose (PRECOSE) 25 MG tablet Take 25 mg by mouth 3 (three) times daily with meals.     Marland Kitchen amLODipine (NORVASC) 5 MG tablet Take 2.5 mg by mouth 2 (two) times daily.     Marland Kitchen aspirin EC 81 MG tablet Take 81 mg by mouth daily.    . calcium-vitamin D (OSCAL WITH D) 500-200 MG-UNIT per tablet Take 1 tablet by mouth 2 (two) times daily.    . Cyanocobalamin (VITAMIN B-12 SL) Place 1 tablet under the tongue daily.    Marland Kitchen ezetimibe-simvastatin (VYTORIN) 10-10 MG per tablet Take 1 tablet by mouth at bedtime.    . ferrous sulfate 325 (65 FE) MG tablet Take 325 mg by mouth 2 (two) times daily with a meal.    . insulin detemir (LEVEMIR) 100 UNIT/ML injection Inject 18 Units into the skin every morning.    Marland Kitchen lisinopril (PRINIVIL,ZESTRIL) 20 MG tablet Take 20 mg by mouth 2 (two) times daily.    Marland Kitchen MAGNESIUM PO Take 1 tablet by mouth daily.    . metFORMIN (GLUCOPHAGE) 1000 MG tablet Take 1,000 mg by mouth 2 (two) times daily with a meal.     . mirabegron ER (MYRBETRIQ) 50 MG TB24 tablet Take 50 mg  by mouth daily.    Marland Kitchen moxifloxacin (VIGAMOX) 0.5 % ophthalmic solution Place 1 drop into the right eye 4 (four) times daily.    . naproxen sodium (ANAPROX) 220 MG tablet Take 220 mg by mouth 2 (two) times daily as needed (pain).    Marland Kitchen NEXIUM 40 MG capsule Take 40 mg by mouth every other day.     . Potassium Gluconate 595 MG CAPS Take 1 capsule by mouth 2 (two) times daily.    . prednisoLONE acetate (PRED FORTE) 1 % ophthalmic suspension Place 1 drop into the right eye 4 (four) times daily.     . tamsulosin (FLOMAX) 0.4 MG CAPS capsule Take 0.4 mg by mouth daily.    . traZODone (DESYREL) 50 MG tablet Take 50 mg by mouth at bedtime.    . vitamin C (ASCORBIC ACID) 500 MG tablet Take 500 mg by mouth daily.       Family  History  Problem Relation Age of Onset  . Heart attack Father   . Leukemia Mother   . Heart Problems Brother   . Other Brother     kidney problems  . Diabetes Mellitus II Sister      Social History   Social History  . Marital Status: Divorced    Spouse Name: N/A  . Number of Children: N/A  . Years of Education: N/A   Occupational History  . Not on file.   Social History Main Topics  . Smoking status: Never Smoker   . Smokeless tobacco: Not on file  . Alcohol Use: 0.0 oz/week    0 Standard drinks or equivalent per week     Comment: very rare (wine at christmas)  . Drug Use: 5.00 per week  . Sexual Activity: Not on file   Other Topics Concern  . Not on file   Social History Narrative     Review of Systems: General: negative for chills, fever, night sweats or weight changes.  Cardiovascular: syncope, leg swelling but no chest pain, dyspnea  Dermatological: negative for rash Respiratory: negative for cough or wheezing Urologic: negative for hematuria Abdominal: negative for nausea, vomiting, diarrhea, bright red blood per rectum, melena, or hematemesis Neurologic: negative for visual changes, syncope, or dizziness Endocrine: no diabetes, no hypothyroidism Immunological: no lymph adenopathy Psych: non homicidal/suicidal  Physical Exam: Vitals: BP 161/85 mmHg  Pulse 106  Temp(Src) 97.4 F (36.3 C) (Oral)  Resp 18  SpO2 99% General: not in acute distress Neck: JVP flat, neck supple Heart: regular rate and rhythm, S1, S2, no murmurs  Lungs: CTAB  GI: non tender, non distended, bowel sounds present Extremities: 1+ edema, erythema on leg Neuro: AAO x 3 5/5 strength bilaterally, cranial II - XII w/o abn Psych: normal affect, no anxiety. Memory loss   Labs:   Results for orders placed or performed during the hospital encounter of 03/31/15 (from the past 24 hour(s))  CBG monitoring, ED     Status: Abnormal   Collection Time: 03/31/15  8:40 PM  Result Value  Ref Range   Glucose-Capillary 138 (H) 65 - 99 mg/dL  CBC with Differential     Status: Abnormal   Collection Time: 03/31/15 10:58 PM  Result Value Ref Range   WBC 7.6 4.0 - 10.5 K/uL   RBC 4.44 3.87 - 5.11 MIL/uL   Hemoglobin 12.5 12.0 - 15.0 g/dL   HCT 40.9 81.1 - 91.4 %   MCV 86.7 78.0 - 100.0 fL   MCH 28.2 26.0 - 34.0  pg   MCHC 32.5 30.0 - 36.0 g/dL   RDW 16.1 09.6 - 04.5 %   Platelets 261 150 - 400 K/uL   Neutrophils Relative % 58 43 - 77 %   Neutro Abs 4.5 1.7 - 7.7 K/uL   Lymphocytes Relative 24 12 - 46 %   Lymphs Abs 1.8 0.7 - 4.0 K/uL   Monocytes Relative 9 3 - 12 %   Monocytes Absolute 0.7 0.1 - 1.0 K/uL   Eosinophils Relative 8 (H) 0 - 5 %   Eosinophils Absolute 0.6 0.0 - 0.7 K/uL   Basophils Relative 1 0 - 1 %   Basophils Absolute 0.0 0.0 - 0.1 K/uL  Comprehensive metabolic panel     Status: Abnormal   Collection Time: 03/31/15 10:58 PM  Result Value Ref Range   Sodium 138 135 - 145 mmol/L   Potassium 3.0 (L) 3.5 - 5.1 mmol/L   Chloride 95 (L) 101 - 111 mmol/L   CO2 30 22 - 32 mmol/L   Glucose, Bld 138 (H) 65 - 99 mg/dL   BUN 10 6 - 20 mg/dL   Creatinine, Ser 4.09 0.44 - 1.00 mg/dL   Calcium 81.1 8.9 - 91.4 mg/dL   Total Protein 7.7 6.5 - 8.1 g/dL   Albumin 3.4 (L) 3.5 - 5.0 g/dL   AST 21 15 - 41 U/L   ALT 13 (L) 14 - 54 U/L   Alkaline Phosphatase 57 38 - 126 U/L   Total Bilirubin 0.6 0.3 - 1.2 mg/dL   GFR calc non Af Amer 58 (L) >60 mL/min   GFR calc Af Amer >60 >60 mL/min   Anion gap 13 5 - 15  Protime-INR     Status: None   Collection Time: 03/31/15 10:58 PM  Result Value Ref Range   Prothrombin Time 13.8 11.6 - 15.2 seconds   INR 1.04 0.00 - 1.49  Magnesium     Status: Abnormal   Collection Time: 03/31/15 10:58 PM  Result Value Ref Range   Magnesium 0.9 (LL) 1.7 - 2.4 mg/dL  Urinalysis, Routine w reflex microscopic     Status: Abnormal   Collection Time: 03/31/15 11:13 PM  Result Value Ref Range   Color, Urine YELLOW YELLOW   APPearance CLOUDY  (A) CLEAR   Specific Gravity, Urine 1.013 1.005 - 1.030   pH 6.5 5.0 - 8.0   Glucose, UA NEGATIVE NEGATIVE mg/dL   Hgb urine dipstick NEGATIVE NEGATIVE   Bilirubin Urine NEGATIVE NEGATIVE   Ketones, ur NEGATIVE NEGATIVE mg/dL   Protein, ur NEGATIVE NEGATIVE mg/dL   Urobilinogen, UA 1.0 0.0 - 1.0 mg/dL   Nitrite NEGATIVE NEGATIVE   Leukocytes, UA TRACE (A) NEGATIVE  Urine microscopic-add on     Status: Abnormal   Collection Time: 03/31/15 11:13 PM  Result Value Ref Range   Squamous Epithelial / LPF FEW (A) RARE   WBC, UA 3-6 <3 WBC/hpf   RBC / HPF 0-2 <3 RBC/hpf   Bacteria, UA RARE RARE   Urine-Other MUCOUS PRESENT      Radiology/Studies: Dg Chest 2 View  04/01/2015   CLINICAL DATA:  81 year old female with dizziness and fall. No chest complaints.  EXAM: CHEST  2 VIEW  COMPARISON:  Radiograph dated 01/06/2009  FINDINGS: The heart size and mediastinal contours are within normal limits. Both lungs are clear. Osteopenia no acute fracture.  IMPRESSION: No active cardiopulmonary disease.   Electronically Signed   By: Elgie Collard M.D.   On: 04/01/2015 00:09  Ct Head Wo Contrast  04/01/2015   CLINICAL DATA:  Multiple falls today with dizziness in generalize weakness.  EXAM: CT HEAD WITHOUT CONTRAST  TECHNIQUE: Contiguous axial images were obtained from the base of the skull through the vertex without intravenous contrast.  COMPARISON:  None.  FINDINGS: Mild diffuse cerebral atrophy. Low-attenuation changes throughout the deep white matter consistent with small vessel ischemia. Old lacunar infarcts in the left thalamus. No mass effect or midline shift. No abnormal extra-axial fluid collections. Gray-white matter junctions are distinct. Basal cisterns are not effaced. No evidence of acute intracranial hemorrhage. No depressed skull fractures. Visualized paranasal sinuses and mastoid air cells are not opacified. Vascular calcifications.  IMPRESSION: No acute intracranial abnormalities. Chronic  atrophy and small vessel ischemic changes.   Electronically Signed   By: Burman Nieves M.D.   On: 04/01/2015 00:17    EKG: today showed multifocal atrial tachycardia, with RVR 108 bpm, normal QTc, RBBB  Echo: not available  Cardiac cath: not available  Medical decision making:  Discussed care with the patient Discussed care with the physician on the phone Reviewed labs and imaging personally Reviewed prior records  ASSESSMENT AND PLAN:  This is a 81 y.o. female with no known history of CAD who presented with a fall and dizziness. She has known history of congestive heart failure, hypertension, diabetes mellitus, cataract, osteoprosis.    Principal Problem:   Multifocal atrial tachycardia Active Problems:   Diabetes   CHF (congestive heart failure)   HTN (hypertension)  Multifocal atrial tachycardia , likely trigger is hypomagensemia  Prior history of CHF, hypertension, age >2, and diabetes (CHADS2VASC = 7, CHADS2 = 4) Treat magnesium deficiency and evaluate for magnesium deficiency if pt has renal tubular acidosis Obtain echocardiogram in AM Checking TSH, electrolytes Keep potassium >4, calcium >9, magnesium >2 Start on lopressor 25 mg BID  Ziopatch/event monitor at discharge for 30 days to r/o atrial fibrillation Check orthostatics daily   Signed, Joellyn Rued, MD MS 04/01/2015, 1:33 AM

## 2015-04-01 NOTE — Progress Notes (Addendum)
[facility] PROGRESS NOTE  Jocelyn Schultz JYN:[id number] DOB: [birth year] DOA: 03/31/2015 PCP: Ailene Ravel, MD   Off Service Summary 81yo presented with MAT with profoundly low K and Mg that were corrected. Cardiology consulted with plans for outpt event monitor if stable overnight.  Assessment/Plan: 1. MAT 1. Cardiology following. Recs to d/c norvasc, added metoprolol 2. Electrolytes corrected 3. Continued on tele. If stable overnight, plans for event monitor as outpatient 2. Hypomagnesemia 1. corrected 3. Hypokalemia 1. corrected 4. DM2 1. Pt continued on SSI coverage 2. Pt is cont on levemir and metformin 5. Chronic systolic CHF 1. 2d echo with EF of 45-50% 6. HTN 1. bp stable 2. Cont monitor 7. DVT prophylaxis 1. Heparin subQ  Code Status: Full Family Communication: Pt in room (indicate person spoken with, relationship, and if by phone, the number) Disposition Plan: Pending   Consultants:  Cardiology  Procedures:    Antibiotics:   (indicate start date, and stop date if known)  HPI/Subjective: Reports feeling well this AM. No chest pain or sob  Objective: Filed Vitals:   04/01/15 0240 04/01/15 0310 04/01/15 0543 04/01/15 0923  BP: 140/68 153/74 154/67 124/54  Pulse: 83 71 74 69  Temp:  97.9 F (36.6 C) 98 F (36.7 C)   TempSrc:  Oral Oral   Resp: 17 18 18 18   Height:  5' (1.524 m)    Weight:  62.143 kg (137 lb)    SpO2: 94% 95% 94% 90%    Intake/Output Summary (Last 24 hours) at 04/01/15 1835 Last data filed at 04/01/15 1639  Gross per 24 hour  Intake   1030 ml  Output    960 ml  Net     70 ml   Filed Weights   04/01/15 0310  Weight: 62.143 kg (137 lb)    Exam:   General:  Awake, in nad  Cardiovascular: regular, s1, s2  Respiratory: normal resp effort, no wheezing  Abdomen: soft,nondistended  Musculoskeletal: perfused, no clubbing   Data Reviewed: Basic Metabolic Panel:  Recent Labs Lab 03/31/15 2258  04/01/15 0428 04/01/15 0850  NA 138 137  --   K 3.0* 3.4* 4.0  CL 95* 97*  --   CO2 30 32  --   GLUCOSE 138* 102*  --   BUN 10 9  --   CREATININE 0.90 0.82  --   CALCIUM 10.1 9.4  --   MG 0.9* 1.4* 1.8   Liver Function Tests:  Recent Labs Lab 03/31/15 2258  AST 21  ALT 13*  ALKPHOS 57  BILITOT 0.6  PROT 7.7  ALBUMIN 3.4*   No results for input(s): LIPASE, AMYLASE in the last 168 hours. No results for input(s): AMMONIA in the last 168 hours. CBC:  Recent Labs Lab 03/31/15 2258 04/01/15 0428  WBC 7.6 6.6  NEUTROABS 4.5  --   HGB 12.5 11.7*  HCT 38.5 35.8*  MCV 86.7 85.9  PLT 261 234   Cardiac Enzymes:  Recent Labs Lab 04/01/15 0317 04/01/15 0428 04/01/15 0850 04/01/15 1420  TROPONINI <0.03 <0.03 <0.03 <0.03   BNP (last 3 results)  Recent Labs  04/01/15 0226 04/01/15 0428  BNP 85.7 99.5    ProBNP (last 3 results) No results for input(s): PROBNP in the last 8760 hours.  CBG:  Recent Labs Lab 03/31/15 2040 04/01/15 0547 04/01/15 1138 04/01/15 1716  GLUCAP 138* 98 140* 97    No results found for this or any previous visit (from the past 240 hour(s)).  Studies: Dg Chest 2 View  04/01/2015   CLINICAL DATA:  81 year old female with dizziness and fall. No chest complaints.  EXAM: CHEST  2 VIEW  COMPARISON:  Radiograph dated 01/06/2009  FINDINGS: The heart size and mediastinal contours are within normal limits. Both lungs are clear. Osteopenia no acute fracture.  IMPRESSION: No active cardiopulmonary disease.   Electronically Signed   By: Elgie Collard M.D.   On: 04/01/2015 00:09   Ct Head Wo Contrast  04/01/2015   CLINICAL DATA:  Multiple falls today with dizziness in generalize weakness.  EXAM: CT HEAD WITHOUT CONTRAST  TECHNIQUE: Contiguous axial images were obtained from the base of the skull through the vertex without intravenous contrast.  COMPARISON:  None.  FINDINGS: Mild diffuse cerebral atrophy. Low-attenuation changes throughout the  deep white matter consistent with small vessel ischemia. Old lacunar infarcts in the left thalamus. No mass effect or midline shift. No abnormal extra-axial fluid collections. Gray-white matter junctions are distinct. Basal cisterns are not effaced. No evidence of acute intracranial hemorrhage. No depressed skull fractures. Visualized paranasal sinuses and mastoid air cells are not opacified. Vascular calcifications.  IMPRESSION: No acute intracranial abnormalities. Chronic atrophy and small vessel ischemic changes.   Electronically Signed   By: Burman Nieves M.D.   On: 04/01/2015 00:17    Scheduled Meds: . acarbose  25 mg Oral TID WC  . aspirin EC  81 mg Oral Daily  . calcium-vitamin D  1 tablet Oral BID  . ezetimibe  10 mg Oral QHS  . ferrous sulfate  325 mg Oral BID WC  . gatifloxacin  1 drop Right Eye QID  . heparin subcutaneous  5,000 Units Subcutaneous 3 times per day  . insulin aspart  0-15 Units Subcutaneous 6 times per day  . insulin detemir  18 Units Subcutaneous Daily  . lisinopril  20 mg Oral BID  . magnesium oxide  200 mg Oral Daily  . metFORMIN  1,000 mg Oral BID WC  . metoprolol tartrate  12.5 mg Oral BID  . mirabegron ER  50 mg Oral Daily  . pantoprazole  80 mg Oral Q1200  . potassium chloride  20 mEq Oral Daily  . prednisoLONE acetate  1 drop Right Eye QID  . simvastatin  10 mg Oral QHS  . tamsulosin  0.4 mg Oral Daily  . traZODone  50 mg Oral QHS  . vitamin C  500 mg Oral Daily   Continuous Infusions:   Principal Problem:   Atrial fibrillation with RVR Active Problems:   Diabetes   CHF (congestive heart failure)   HTN (hypertension)   Atrial fibrillation with rapid ventricular response   Hypomagnesemia   Hypokalemia   CHIU, STEPHEN K  [facility] Pager [phone]. If 7PM-7AM, please contact night-coverage at [email or web address], password [facility] 04/01/2015, 6:35 PM  LOS: 0 days

## 2015-04-02 ENCOUNTER — Encounter (HOSPITAL_COMMUNITY): Payer: Self-pay | Admitting: Physician Assistant

## 2015-04-02 ENCOUNTER — Ambulatory Visit (HOSPITAL_COMMUNITY): Payer: Medicare Other

## 2015-04-02 DIAGNOSIS — R55 Syncope and collapse: Secondary | ICD-10-CM

## 2015-04-02 LAB — GLUCOSE, CAPILLARY
GLUCOSE-CAPILLARY: 85 mg/dL (ref 65–99)
GLUCOSE-CAPILLARY: 90 mg/dL (ref 65–99)
Glucose-Capillary: 165 mg/dL — ABNORMAL HIGH (ref 65–99)
Glucose-Capillary: 75 mg/dL (ref 65–99)
Glucose-Capillary: 78 mg/dL (ref 65–99)
Glucose-Capillary: 113 mg/dL — ABNORMAL HIGH (ref 65–99)

## 2015-04-02 LAB — BASIC METABOLIC PANEL
ANION GAP: 6 (ref 5–15)
BUN: 8 mg/dL (ref 6–20)
CALCIUM: 9.2 mg/dL (ref 8.9–10.3)
CO2: 31 mmol/L (ref 22–32)
Chloride: 99 mmol/L — ABNORMAL LOW (ref 101–111)
Creatinine, Ser: 0.84 mg/dL (ref 0.44–1.00)
Glucose, Bld: 82 mg/dL (ref 65–99)
POTASSIUM: 3.8 mmol/L (ref 3.5–5.1)
Sodium: 136 mmol/L (ref 135–145)

## 2015-04-02 LAB — HEMOGLOBIN A1C
HEMOGLOBIN A1C: 7 % — AB (ref 4.8–5.6)
Mean Plasma Glucose: 154 mg/dL

## 2015-04-02 LAB — MAGNESIUM: Magnesium: 1.4 mg/dL — ABNORMAL LOW (ref 1.7–2.4)

## 2015-04-02 MED ORDER — MAGNESIUM OXIDE 400 (241.3 MG) MG PO TABS: 400.0000 mg | ORAL_TABLET | Freq: Two times a day (BID) | ORAL | Status: AC

## 2015-04-02 MED ORDER — MAGNESIUM SULFATE 2 GM/50ML IV SOLN
2.0000 g | Freq: Once | INTRAVENOUS | Status: AC
  Administered 2015-04-02: 2 g via INTRAVENOUS
  Filled 2015-04-02: qty 50

## 2015-04-02 NOTE — Clinical Social Work Note (Signed)
Clinical Social Work Assessment  Patient Details  Name: Jocelyn Schultz MRN: [id number] Date of Birth: 1930/11/06  Date of referral:  04/02/15               Reason for consult:  Facility Placement                Permission sought to share information with:  Facility Sport and exercise psychologist, Family Supports Permission granted to share information::  Yes, Verbal Permission Granted  Name::     [place] SNFs, Daughter and Tree surgeon::     Relationship::     Contact Information:     Housing/Transportation Living arrangements for the past 2 months:  Single Family Home Source of Information:  Patient Patient Interpreter Needed:  None Criminal Activity/Legal Involvement Pertinent to Current Situation/Hospitalization:  No - Comment as needed Significant Relationships:  Adult Children Lives with:  Adult Children (Daughter and son-in-law live with patient) Do you feel safe going back to the place where you live?  Yes Need for family participation in patient care:  Yes (Comment)  Care giving concerns:  Lives at home with supportive family but feels she would benefit from short term SNF for "a week or two" due to weakness.   Social Worker assessment / plan:  CSW met with patient to discuss SNF process. She states that her granddaughter Champ Mungo works at [employer] and is encouraging her to seek short term SNF until she is stronger.  PT recommends SNF placement and patient is agreeable. She agreed to bed search in [place] and [facility] but hopes for [facility]  Fl2 completed and placed on chart for MD's signature. Active bed search in progress. Per Dr. [place] Pink- d/c date anticipated for tomorrow.  Employment status:  Retired Nurse, adult PT Recommendations:  [place] / Referral to community resources:  [place]  Patient/Family's Response to care:  Patient states she is  agreeable and pleased with current d/c plan to SNF. She only hopes it will be for a few weeks as she wants to return home.    Patient/Family's Understanding of and Emotional Response to Diagnosis, Current Treatment, and Prognosis:  Pateint verbalizes a strong understanding of reason for SNF placement as well as her current medical condition. She feels that this would be best course of treatment for her at this time. She exhibits a very positive attitude and is extremely engaged in her d/c plan. She is alert and oriented- makes her own medical decisions but does not mind her family being involved in her d/c planning process.    Emotional Assessment Appearance:  Appears stated age Attitude/Demeanor/Rapport:   (Calm, cooperative, responsive, rational) Affect (typically observed):  Calm, Happy, Pleasant Orientation:  Oriented to Self, Oriented to Place, Oriented to  Time, Oriented to Situation Alcohol / Substance use:  Never Used Psych involvement (Current and /or in the community):  No (Comment)  Discharge Needs  Concerns to be addressed:  Care Coordination Readmission within the last 30 days:  No Current discharge risk:  None Barriers to Discharge:  No Barriers Identified   Williemae Area, LCSW 04/02/2015, 5:35 PM

## 2015-04-02 NOTE — Progress Notes (Signed)
VASCULAR LAB PRELIMINARY  PRELIMINARY  PRELIMINARY  PRELIMINARY  Carotid duplex  completed.    Preliminary report:  Bilateral:  1-39% ICA stenosis.  Vertebral artery flow is antegrade.      Elise Knobloch, RVT 04/02/2015, 2:03 PM

## 2015-04-02 NOTE — Evaluation (Signed)
Occupational Therapy Evaluation Patient Details Name: Jocelyn Schultz MRN: [id number] DOB: Jan 14, 1931 Today's Date: 04/02/2015    History of Present Illness Jocelyn Schultz is a 81 y.o. female with history of CHF CAD HTN HLD DM2 presents to the ED with a episode of falling repeatedly.  Found to have MAT with profoundly low K and Mg that have been corrected.   Clinical Impression   Pt admitted with above. She demonstrates the below listed deficits and will benefit from continued OT to maximize safety and independence with BADLs.  Pt currently requires min A for ADLs.  Daughter with whom she was living, is preparing to undergo surgery, and will no longer be able to provide this level of care to pt.  Pt will require 24 hour assist at discharge.  Feel she would benefit from SNF level rehab to maximize her safety and independence with ADLs.       Follow Up Recommendations  SNF;Supervision/Assistance - 24 hour    Equipment Recommendations  None recommended by OT    Recommendations for Other Services       Precautions / Restrictions Precautions Precautions: Fall Restrictions Weight Bearing Restrictions: No      Mobility Bed Mobility Overal bed mobility: Needs Assistance Bed Mobility: Supine to Sit;Sit to Supine     Supine to sit: Supervision Sit to supine: Supervision   General bed mobility comments: dependent on bedrails   Transfers Overall transfer level: Needs assistance Equipment used: Rolling walker (2 wheeled) Transfers: Sit to/from [employer] Sit to Stand: Min assist Stand pivot transfers: Min assist       General transfer comment: Min A to to steady     Balance Overall balance assessment: Needs assistance Sitting-balance support: Feet supported Sitting balance-Leahy Scale: Normal     Standing balance support: Bilateral upper extremity supported;During functional activity Standing balance-Leahy Scale: Poor Standing balance comment:  Requires min A                             ADL Overall ADL's : Needs assistance/impaired Eating/Feeding: Independent   Grooming: Wash/dry hands;Wash/dry face;Oral care;Brushing hair;Set up;Sitting   Upper Body Bathing: Sitting;Minimal assitance   Lower Body Bathing: Minimal assistance;Sit to/from stand   Upper Body Dressing : Minimal assistance;Sitting   Lower Body Dressing: Minimal assistance;Sit to/from stand   Toilet Transfer: Minimal assistance;Ambulation;Comfort height toilet   Toileting- Clothing Manipulation and Hygiene: Minimal assistance;Sit to/from stand       Functional mobility during ADLs: Minimal assistance;Rolling walker       Vision     Perception     Praxis      Pertinent Vitals/Pain Pain Assessment: Faces Faces Pain Scale: Hurts little more Pain Location: lt UE with shoulder elevation  Pain Descriptors / Indicators: Aching Pain Intervention(s): Monitored during session     Hand Dominance     Extremity/Trunk Assessment Upper Extremity Assessment Upper Extremity Assessment: Generalized weakness;LUE deficits/detail LUE Deficits / Details: Pt demonstrated AROM WFL (~115-120), but demonstrates strength grossly 4/5   Lower Extremity Assessment Lower Extremity Assessment: Defer to PT evaluation RLE Deficits / Details: AROM WFL, strength grossly 4/5 LLE Deficits / Details: AROM WFL, strength grossly 4/5, noted wrinkling of skin over distal lower leg with reports of usual swelling in this leg and pt reports numbness in foot   Cervical / Trunk Assessment Cervical / Trunk Assessment: Kyphotic;Other exceptions Cervical / Trunk Exceptions: capital extension of neck/head   Communication Communication  Communication: No difficulties   Cognition Arousal/Alertness: Awake/alert Behavior During Therapy: WFL for tasks assessed/performed Overall Cognitive Status: Within Functional Limits for tasks assessed                     General  Comments       Exercises       Shoulder Instructions      Home Living Family/patient expects to be discharged to:: Skilled nursing facility Living Arrangements: Children                               Additional Comments: daughter reports her sister whom patient has been living with is brittle diabetic and now will be undergoing toe amputation and not able to care for patient      Prior Functioning/Environment Level of Independence: Needs assistance  Gait / Transfers Assistance Needed: able to walk with cane PTA ADL's / Homemaking Assistance Needed: assist for meals, for shower, patient did small "wash up" but other daughter would come to help with shower twice a week; had assist for medications   Comments: Pt and family report she has had several falls     OT Diagnosis: Generalized weakness;Acute pain   OT Problem List: Decreased strength;Decreased activity tolerance;Impaired balance (sitting and/or standing);Decreased safety awareness;Decreased knowledge of use of DME or AE;Pain   OT Treatment/Interventions: Self-care/ADL training;DME and/or AE instruction;Therapeutic activities;Balance training;Patient/family education    OT Goals(Current goals can be found in the care plan section) Acute Rehab OT Goals Patient Stated Goal: To go to rehab and get stronger OT Goal Formulation: With patient Time For Goal Achievement: 04/16/15 Potential to Achieve Goals: Good ADL Goals Pt Will Perform Grooming: with supervision;standing Pt Will Perform Upper Body Bathing: with supervision;sitting;standing Pt Will Perform Lower Body Bathing: with supervision;sit to/from stand Pt Will Perform Upper Body Dressing: with set-up;sitting Pt Will Perform Lower Body Dressing: with supervision;sit to/from stand Pt Will Transfer to Toilet: with supervision;ambulating;regular height toilet;bedside commode;grab bars Pt Will Perform Toileting - Clothing Manipulation and hygiene: with  supervision;sit to/from stand  OT Frequency: Min 2X/week   Barriers to D/C: Decreased caregiver support          Co-evaluation              End of Session Equipment Utilized During Treatment: Rolling walker Nurse Communication: Mobility status  Activity Tolerance: Patient tolerated treatment well Patient left: in bed;with call bell/phone within reach;with family/visitor present   Time: 1105-1131 OT Time Calculation (min): 26 min Charges:  OT General Charges $OT Visit: 1 Procedure OT Evaluation $Initial OT Evaluation Tier I: 1 Procedure OT Treatments $Self Care/Home Management : 8-22 mins G-Codes:    Cristian Grieves M 04-07-2015, 1:12 PM

## 2015-04-02 NOTE — Evaluation (Signed)
Physical Therapy Evaluation Patient Details Name: Jocelyn Schultz MRN: [id number] DOB: [birth year] Today's Date: 04/02/2015   History of Present Illness  Jocelyn Schultz is a 81 y.o. female with history of CHF CAD HTN HLD DM2 presents to the ED with a episode of falling repeatedly.  Found to have MAT with profoundly low K and Mg that have been corrected.  Clinical Impression  Patient presents with decreased independence with mobility along with difficulty normally with ADL's and will benefit from skilled PT in the acute setting to maximize safety and independence prior to d/c potentially to ALF.  Daughter reports niece works at [employer] in [place] [address] so may prefer to go there if possible for rehab.    Follow Up Recommendations SNF;Supervision for mobility/OOB    Equipment Recommendations  None recommended by PT    Recommendations for Other Services       Precautions / Restrictions Precautions Precautions: Fall      Mobility  Bed Mobility Overal bed mobility: Needs Assistance Bed Mobility: Supine to Sit;Sit to Supine     Supine to sit: Supervision Sit to supine: Supervision   General bed mobility comments: increased time and effort for coming up to sit  Transfers Overall transfer level: Needs assistance Equipment used: Rolling walker (2 wheeled) Transfers: Sit to/from Stand Sit to Stand: Min assist         General transfer comment: up from bed for safety and balance; able to stand from toilet in bathroom with supervision and use of grabbar  Ambulation/Gait Ambulation/Gait assistance: Min guard;Supervision Ambulation Distance (Feet): 200 Feet Assistive device: Rolling walker (2 wheeled) Gait Pattern/deviations: Step-through pattern;Antalgic;Trunk flexed     General Gait Details: some antalgia on left patient reports due to sorenss lower leg  Stairs            Wheelchair Mobility    Modified Rankin (Stroke Patients Only)       Balance Overall balance  assessment: Needs assistance         Standing balance support: No upper extremity supported Standing balance-Leahy Scale: Poor Standing balance comment: when standing without UE support needs minassist and braces legs against bed                             Pertinent Vitals/Pain Pain Assessment: Faces Faces Pain Scale: Hurts little more Pain Location: left arm with movement due to falling on arm Pain Descriptors / Indicators: Sore Pain Intervention(s): Monitored during session    Home Living Family/patient expects to be discharged to:: Skilled nursing facility Living Arrangements: Children               Additional Comments: daughter reports her sister whom patient has been living with is brittle diabetic and now will be undergoing toe amputation and not able to care for patient    Prior Function Level of Independence: Needs assistance   Gait / Transfers Assistance Needed: able to walk with cane PTA  ADL's / Homemaking Assistance Needed: assist for meals, for shower, patient did small "wash up" but other daughter would come to help with shower twice a week; had assist for medications        Hand Dominance        Extremity/Trunk Assessment   Upper Extremity Assessment: LUE deficits/detail       LUE Deficits / Details: limited AROM shoulder flexion about 80 with pain, strength at least 3+/5 NT fully due to pain   Lower  Extremity Assessment: RLE deficits/detail;LLE deficits/detail RLE Deficits / Details: AROM WFL, strength grossly 4/5 LLE Deficits / Details: AROM WFL, strength grossly 4/5, noted wrinkling of skin over distal lower leg with reports of usual swelling in this leg and pt reports numbness in foot  Cervical / Trunk Assessment: Kyphotic;Other exceptions  Communication   Communication: No difficulties  Cognition Arousal/Alertness: Awake/alert Behavior During Therapy: WFL for tasks assessed/performed Overall Cognitive Status: Within  Functional Limits for tasks assessed                      General Comments General comments (skin integrity, edema, etc.): mild edema and scrapes post left elbow from previous fall    Exercises General Exercises - Lower Extremity Ankle Circles/Pumps: AROM;Both;5 reps;Supine Heel Slides: AROM;Both;5 reps;Supine      Assessment/Plan    PT Assessment Patient needs continued PT services  PT Diagnosis Abnormality of gait;Generalized weakness   PT Problem List Decreased strength;Decreased activity tolerance;Decreased balance;Decreased mobility;Decreased safety awareness  PT Treatment Interventions Balance training;DME instruction;Gait training;Functional mobility training;Therapeutic activities;Therapeutic exercise;Patient/family education   PT Goals (Current goals can be found in the Care Plan section) Acute Rehab PT Goals Patient Stated Goal: Per daughter to go to rehab PT Goal Formulation: With patient/family Time For Goal Achievement: 04/16/15 Potential to Achieve Goals: Good    Frequency Min 3X/week   Barriers to discharge Decreased caregiver support daughter reports other daughter with whom pt lives will not be able to assist due to her own health issues and present daughter taking a job out of state    Co-evaluation               End of Session Equipment Utilized During Treatment: Gait belt Activity Tolerance: Patient tolerated treatment well Patient left: in bed;with call bell/phone within reach;with family/visitor present           Time: 0950-1026 PT Time Calculation (min) (ACUTE ONLY): 36 min   Charges:   PT Evaluation $Initial PT Evaluation Tier I: 1 Procedure PT Treatments $Gait Training: 8-22 mins   PT G Codes:        WYNN,CYNDI April 14, 2015, 11:32 AM  Sheran Lawless, PT [phone] April 14, 2015

## 2015-04-02 NOTE — Progress Notes (Addendum)
[facility] PROGRESS NOTE  Jocelyn Schultz XBJ:[id number] DOB: 1931-02-11 DOA: 03/31/2015 PCP: Ailene Ravel, MD   Brief Summary 81yo presented with complaints of dizziness and falls. Possible syncopal episode. She was found to have MAT with profoundly low K and Mg that were corrected. Cardiology consulted with plans for outpt event monitor.   Assessment/Plan: 1. Multifocal Atrial Tachycardia in the setting of near syncope and syncopal episodes 1. Cardiology following. Recs to d/c norvasc, added metoprolol. We will arrange outpatient event monitor. 2. Electrolytes corrected 3. No arrhythmias on telemetry. PT and OT to evaluate due to history of falls. UA did not suggest infection. No focal neurological deficits. 2. Hypomagnesemia 1. Was aggressively repleted. But noted to be low this morning. We will give additional intravenous dose and increase the dose of her oral magnesium. 3. Hypokalemia 1. Corrected. Keep close to 4 4. DM2, without complications 1. Pt continued on SSI coverage 2. Pt is on levemir and metformin 5. Chronic systolic CHF 1. 2d echo with EF of 45-50%. Will need outpatient follow-up and management including outpatient stress test. 6. Essential HTN 1. bp stable 2. Cont to monitor 7. DVT prophylaxis 1. Heparin subQ  Code Status: Full Family Communication: Pt in room (indicate person spoken with, relationship, and if by phone, the number) Disposition Plan: PT and OT to evaluate. She might need home health.   Consultants:  Cardiology  Procedures: 2-D echocardiogram Study Conclusions - Left ventricle: The cavity size was normal. Wall thickness wasnormal. Systolic function was mildly reduced. The estimatedejection fraction was in the range of 45% to 50%. Diffusehypokinesis. - Mitral valve: Calcified annulus. - Left atrium: The atrium was moderately dilated. - Right ventricle: The cavity size was mildly dilated. Wallthickness was normal. - Right atrium:  The atrium was mildly dilated.  Antibiotics: None  Subjective: Patient feels well this morning. Daughter is at bedside. Apparently, patient has been having dizziness and frequent falls for the last many weeks. Neither any chest pain, shortness of breath this morning.   Objective: Filed Vitals:   04/01/15 2024 04/02/15 0130 04/02/15 0431 04/02/15 1030  BP: 114/50 126/54 136/54 149/54  Pulse: 75 60 60 65  Temp: 98 F (36.7 C) 98 F (36.7 C) 98.1 F (36.7 C)   TempSrc: Oral Oral Oral   Resp: 18 18 18    Height:      Weight:   62.097 kg (136 lb 14.4 oz)   SpO2: 100% 94% 95%     Intake/Output Summary (Last 24 hours) at 04/02/15 1352 Last data filed at 04/02/15 1232  Gross per 24 hour  Intake    775 ml  Output   1200 ml  Net   -425 ml   Filed Weights   04/01/15 0310 04/02/15 0431  Weight: 62.143 kg (137 lb) 62.097 kg (136 lb 14.4 oz)    Exam:   General:  Awake and alert. In no distress.  Cardiovascular: S1, S2 is normal, regular. No S3, S4. No rubs, murmurs, or bruit. No pedal edema.  Respiratory: Good auscultation bilaterally. No wheezing, rales or rhonchi.  Abdomen: Soft. Nondistended. Bowel sounds are present. No masses or organomegaly  Neurological: Alert and oriented 3. No focal neurological deficits noted.   Data Reviewed: Basic Metabolic Panel:  Recent Labs Lab 03/31/15 2258 04/01/15 0428 04/01/15 0850 04/02/15 0337  NA 138 137  --  136  K 3.0* 3.4* 4.0 3.8  CL 95* 97*  --  99*  CO2 30 32  --  31  GLUCOSE  138* 102*  --  82  BUN 10 9  --  8  CREATININE 0.90 0.82  --  0.84  CALCIUM 10.1 9.4  --  9.2  MG 0.9* 1.4* 1.8 1.4*   Liver Function Tests:  Recent Labs Lab 03/31/15 2258  AST 21  ALT 13*  ALKPHOS 57  BILITOT 0.6  PROT 7.7  ALBUMIN 3.4*   CBC:  Recent Labs Lab 03/31/15 2258 04/01/15 0428  WBC 7.6 6.6  NEUTROABS 4.5  --   HGB 12.5 11.7*  HCT 38.5 35.8*  MCV 86.7 85.9  PLT 261 234   Cardiac Enzymes:  Recent Labs Lab  04/01/15 0317 04/01/15 0428 04/01/15 0850 04/01/15 1420  TROPONINI <0.03 <0.03 <0.03 <0.03   BNP (last 3 results)  Recent Labs  04/01/15 0226 04/01/15 0428  BNP 85.7 99.5    CBG:  Recent Labs Lab 04/01/15 1716 04/01/15 2024 04/02/15 0013 04/02/15 0435 04/02/15 1108  GLUCAP 97 105* 85 90 165*    No results found for this or any previous visit (from the past 240 hour(s)).   Studies: Dg Chest 2 View  04/01/2015   CLINICAL DATA:  81 year old female with dizziness and fall. No chest complaints.  EXAM: CHEST  2 VIEW  COMPARISON:  Radiograph dated 01/06/2009  FINDINGS: The heart size and mediastinal contours are within normal limits. Both lungs are clear. Osteopenia no acute fracture.  IMPRESSION: No active cardiopulmonary disease.   Electronically Signed   By: Elgie Collard M.D.   On: 04/01/2015 00:09   Ct Head Wo Contrast  04/01/2015   CLINICAL DATA:  Multiple falls today with dizziness in generalize weakness.  EXAM: CT HEAD WITHOUT CONTRAST  TECHNIQUE: Contiguous axial images were obtained from the base of the skull through the vertex without intravenous contrast.  COMPARISON:  None.  FINDINGS: Mild diffuse cerebral atrophy. Low-attenuation changes throughout the deep white matter consistent with small vessel ischemia. Old lacunar infarcts in the left thalamus. No mass effect or midline shift. No abnormal extra-axial fluid collections. Gray-white matter junctions are distinct. Basal cisterns are not effaced. No evidence of acute intracranial hemorrhage. No depressed skull fractures. Visualized paranasal sinuses and mastoid air cells are not opacified. Vascular calcifications.  IMPRESSION: No acute intracranial abnormalities. Chronic atrophy and small vessel ischemic changes.   Electronically Signed   By: Burman Nieves M.D.   On: 04/01/2015 00:17    Scheduled Meds: . acarbose  25 mg Oral TID WC  . aspirin EC  81 mg Oral Daily  . calcium-vitamin D  1 tablet Oral BID  .  ezetimibe  10 mg Oral QHS  . ferrous sulfate  325 mg Oral BID WC  . gatifloxacin  1 drop Right Eye QID  . heparin subcutaneous  5,000 Units Subcutaneous 3 times per day  . insulin aspart  0-15 Units Subcutaneous 6 times per day  . insulin detemir  18 Units Subcutaneous Daily  . lisinopril  20 mg Oral BID  . magnesium oxide  200 mg Oral Daily  . metFORMIN  1,000 mg Oral BID WC  . metoprolol tartrate  12.5 mg Oral BID  . mirabegron ER  50 mg Oral Daily  . pantoprazole  80 mg Oral Q1200  . potassium chloride  20 mEq Oral Daily  . prednisoLONE acetate  1 drop Right Eye QID  . simvastatin  10 mg Oral QHS  . tamsulosin  0.4 mg Oral Daily  . traZODone  50 mg Oral QHS  . vitamin C  500 mg Oral Daily   Continuous Infusions:   Principal Problem:   Multifocal atrial tachycardia Active Problems:   Diabetes   CHF (congestive heart failure)   HTN (hypertension)   Hypomagnesemia   Hypokalemia   Syncope   [facility]  [facility] Pager [phone].   If 7PM-7AM, please contact night-coverage at [email or web address], password [facility] 04/02/2015, 1:52 PM  LOS: 1 day

## 2015-04-02 NOTE — Clinical Social Work Placement (Signed)
   CLINICAL SOCIAL WORK PLACEMENT  NOTE  Date:  04/02/2015  Patient Details  Name: Jocelyn Schultz MRN: [id number] Date of Birth: 12/23/1930  Clinical Social Work is seeking post-discharge placement for this patient at the Skilled  Nursing Facility level of care (*CSW will initial, date and re-position this form in  chart as items are completed):  Yes   Patient/family provided with [place] Clinical Social Work Department's list of facilities offering this level of care within the geographic area requested by the patient (or if unable, by the patient's family).  Yes   Patient/family informed of their freedom to choose among providers that offer the needed level of care, that participate in Medicare, Medicaid or managed care program needed by the patient, have an available bed and are willing to accept the patient.  Yes   Patient/family informed of [facility]'s ownership interest in [facility] and [facility], as well as of the fact that they are under no obligation to receive care at these facilities.  PASRR submitted to EDS on 04/02/15     PASRR number received on 04/02/15     Existing PASRR number confirmed on       FL2 transmitted to all facilities in geographic area requested by pt/family on 04/02/15     FL2 transmitted to all facilities within larger geographic area on       Patient informed that his/her managed care company has contracts with or will negotiate with certain facilities, including the following:   [facility])         Patient/family informed of bed offers received.  Patient chooses bed at       Physician recommends and patient chooses bed at      Patient to be transferred to   on  .  Patient to be transferred to facility by [facility])     Patient family notified on   of transfer.  Name of family member notified:        PHYSICIAN Please prepare priority discharge summary, including medications, Please sign FL2, Please  prepare prescriptions     Additional Comment:    _______________________________________________ Darylene Price, LCSW 04/02/2015, 10:04 PM

## 2015-04-02 NOTE — Progress Notes (Signed)
Patient Name: Jocelyn Schultz Date of Encounter: 04/02/2015     Principal Problem:   Atrial fibrillation with RVR Active Problems:   Diabetes   CHF (congestive heart failure)   HTN (hypertension)   Atrial fibrillation with rapid ventricular response   Hypomagnesemia   Hypokalemia    SUBJECTIVE  Feeling okay. No CP or SOB. Daughter has many questions.   CURRENT MEDS . acarbose  25 mg Oral TID WC  . aspirin EC  81 mg Oral Daily  . calcium-vitamin D  1 tablet Oral BID  . ezetimibe  10 mg Oral QHS  . ferrous sulfate  325 mg Oral BID WC  . gatifloxacin  1 drop Right Eye QID  . heparin subcutaneous  5,000 Units Subcutaneous 3 times per day  . insulin aspart  0-15 Units Subcutaneous 6 times per day  . insulin detemir  18 Units Subcutaneous Daily  . lisinopril  20 mg Oral BID  . magnesium oxide  200 mg Oral Daily  . magnesium sulfate 1 - 4 g bolus IVPB  2 g Intravenous Once  . metFORMIN  1,000 mg Oral BID WC  . metoprolol tartrate  12.5 mg Oral BID  . mirabegron ER  50 mg Oral Daily  . pantoprazole  80 mg Oral Q1200  . potassium chloride  20 mEq Oral Daily  . prednisoLONE acetate  1 drop Right Eye QID  . simvastatin  10 mg Oral QHS  . tamsulosin  0.4 mg Oral Daily  . traZODone  50 mg Oral QHS  . vitamin C  500 mg Oral Daily    OBJECTIVE  Filed Vitals:   04/01/15 0923 04/01/15 2024 04/02/15 0130 04/02/15 0431  BP: 124/54 114/50 126/54 136/54  Pulse: 69 75 60 60  Temp:  98 F (36.7 C) 98 F (36.7 C) 98.1 F (36.7 C)  TempSrc:  Oral Oral Oral  Resp: Height:      Weight:    136 lb 14.4 oz (62.097 kg)  SpO2: 90% 100% 94% 95%    Intake/Output Summary (Last 24 hours) at 04/02/15 1016 Last data filed at 04/02/15 0941  Gross per 24 hour  Intake    820 ml  Output   1650 ml  Net   -830 ml   Filed Weights   04/01/15 0310 04/02/15 0431  Weight: 137 lb (62.143 kg) 136 lb 14.4 oz (62.097 kg)    PHYSICAL EXAM  General: Pleasant, NAD. Neuro: Alert  and oriented X 3. Moves all extremities spontaneously. Psych: Normal affect. HEENT:  Normal  Neck: Supple without bruits or JVD. Lungs:  Resp regular and unlabored, CTA. Heart: RRR no s3, s4, or murmurs. Abdomen: Soft, non-tender, non-distended, BS + x 4.  Extremities: No clubbing, cyanosis. 1+ L>R edema. DP/PT/Radials 2+ and equal bilaterally.  Accessory Clinical Findings  CBC  Recent Labs  03/31/15 2258 04/01/15 0428  WBC 7.6 6.6  NEUTROABS 4.5  --   HGB 12.5 11.7*  HCT 38.5 35.8*  MCV 86.7 85.9  PLT 261 234   Basic Metabolic Panel  Recent Labs  04/01/15 0428 04/01/15 0850 04/02/15 0337  NA 137  --  136  K 3.4* 4.0 3.8  CL 97*  --  99*  CO2 32  --  31  GLUCOSE 102*  --  82  BUN 9  --  8  CREATININE 0.82  --  0.84  CALCIUM 9.4  --  9.2  MG 1.4* 1.8 1.4*  Liver Function Tests  Recent Labs  03/31/15 2258  AST 21  ALT 13*  ALKPHOS 57  BILITOT 0.6  PROT 7.7  ALBUMIN 3.4*   No results for input(s): LIPASE, AMYLASE in the last 72 hours. Cardiac Enzymes  Recent Labs  04/01/15 0428 04/01/15 0850 04/01/15 1420  TROPONINI <0.03 <0.03 <0.03   BNP Invalid input(s): POCBNP D-Dimer No results for input(s): DDIMER in the last 72 hours. Hemoglobin A1C  Recent Labs  04/01/15 0428  HGBA1C 7.0*   Fasting Lipid Panel  Recent Labs  04/01/15 0428  CHOL 111  HDL 37*  LDLCALC 57  TRIG 84  CHOLHDL 3.0   Thyroid Function Tests  Recent Labs  04/01/15  TSH 1.923    TELE  NSR with some PACs  Radiology/Studies  Dg Chest 2 View  04/01/2015   CLINICAL DATA:  81 year old female with dizziness and fall. No chest complaints.  EXAM: CHEST  2 VIEW  COMPARISON:  Radiograph dated 01/06/2009  FINDINGS: The heart size and mediastinal contours are within normal limits. Both lungs are clear. Osteopenia no acute fracture.  IMPRESSION: No active cardiopulmonary disease.   Electronically Signed   By: Elgie Collard M.D.   On: 04/01/2015 00:09   Ct Head Wo  Contrast  04/01/2015   CLINICAL DATA:  Multiple falls today with dizziness in generalize weakness.  EXAM: CT HEAD WITHOUT CONTRAST  TECHNIQUE: Contiguous axial images were obtained from the base of the skull through the vertex without intravenous contrast.  COMPARISON:  None.  FINDINGS: Mild diffuse cerebral atrophy. Low-attenuation changes throughout the deep white matter consistent with small vessel ischemia. Old lacunar infarcts in the left thalamus. No mass effect or midline shift. No abnormal extra-axial fluid collections. Gray-white matter junctions are distinct. Basal cisterns are not effaced. No evidence of acute intracranial hemorrhage. No depressed skull fractures. Visualized paranasal sinuses and mastoid air cells are not opacified. Vascular calcifications.  IMPRESSION: No acute intracranial abnormalities. Chronic atrophy and small vessel ischemic changes.   Electronically Signed   By: Burman Nieves M.D.   On: 04/01/2015 00:17    2D ECHO 04/01/2015 LV EF: 45- 50% Study Conclusions - Left ventricle: The cavity size was normal. Wall thickness was normal. Systolic function was mildly reduced. The estimated ejection fraction was in the range of 45% to 50%. Diffuse hypokinesis. - Mitral valve: Calcified annulus. - Left atrium: The atrium was moderately dilated. - Right ventricle: The cavity size was mildly dilated. Wall thickness was normal. - Right atrium: The atrium was mildly dilated.    ASSESSMENT AND PLAN  Jocelyn Schultz is a 81 y.o. female with a history of diastolic CHF, hypertension, diabetes mellitus, cataract and osteoporosis who was admitted to [facility] on 04/01/15 with syncope and ECG revealed MAT.  Multifocal atrial tachycardia , likely trigger is hypomagensemia  -- Prior history of dCHF, hypertension, age >65, and diabetes (CHADS2VASC = 7, CHADS2 = 4) -- Norvasc discontinued. Metoprolol 12.5 mg twice a day added -- 2D ECHO with new mild LV dysfunction (EF 60% by LV  gram in 2004). Now EF 45-50% w/ diffuse HK. Moderately dilated LA, mild RV dilation and mild RA dilation.  -- Event monitor at discharge for 30 days to r/o atrial fibrillation  Hypomagensemia - initially 1.4. This was repleted to 1.8, but today it is back down to 1.4. -- Continue mag ox.     New systolic CHF- -- Now EF 45-50% w/ diffuse HK. Appears euvolemic. No s/s CHF. -- Continue ACE  and BB.   DM- Hg A1c 7.   SignedJanetta Hora PA-C  Pager [phone] As above, patient seen and examined. She denies dyspnea or chest pain. Echocardiogram shows ejection fraction 45-50%. No significant arrhythmias on telemetry over the past 24 hours. Patient can be discharged from a cardiac standpoint. Would continue present medications including low-dose beta blocker. Plan outpatient [facility] for further evaluation of syncope. Would also arrange outpatient Lexiscan nuclear study to screen for coronary disease given mildly reduced LV function. Continue beta blocker and ACE inhibitor. Patient will need follow-up laboratories including potassium and magnesium with her primary care physician. Olga Millers

## 2015-04-03 LAB — BASIC METABOLIC PANEL
Anion gap: 6 (ref 5–15)
BUN: 9 mg/dL (ref 6–20)
CALCIUM: 9 mg/dL (ref 8.9–10.3)
CO2: 29 mmol/L (ref 22–32)
CREATININE: 0.83 mg/dL (ref 0.44–1.00)
Chloride: 101 mmol/L (ref 101–111)
GFR calc non Af Amer: >60 mL/min (ref >60)
Glucose, Bld: 81 mg/dL (ref 65–99)
Potassium: 3.9 mmol/L (ref 3.5–5.1)
SODIUM: 136 mmol/L (ref 135–145)

## 2015-04-03 LAB — GLUCOSE, CAPILLARY
GLUCOSE-CAPILLARY: 90 mg/dL (ref 65–99)
GLUCOSE-CAPILLARY: 93 mg/dL (ref 65–99)
Glucose-Capillary: 99 mg/dL (ref 65–99)

## 2015-04-03 LAB — MAGNESIUM: MAGNESIUM: 1.7 mg/dL (ref 1.7–2.4)

## 2015-04-03 MED ORDER — LOPERAMIDE HCL 2 MG PO CAPS
4.0000 mg | ORAL_CAPSULE | Freq: Once | ORAL | Status: AC
  Administered 2015-04-03: 4 mg via ORAL
  Filled 2015-04-03: qty 2

## 2015-04-03 MED ORDER — METOPROLOL TARTRATE 25 MG PO TABS: 12.5000 mg | ORAL_TABLET | Freq: Two times a day (BID) | ORAL | Status: DC

## 2015-04-03 MED ORDER — LOPERAMIDE HCL 2 MG PO CAPS: 2.0000 mg | ORAL_CAPSULE | ORAL | Status: DC | PRN

## 2015-04-03 NOTE — Clinical Social Work Placement (Addendum)
   CLINICAL SOCIAL WORK PLACEMENT  NOTE  Date:  04/03/2015  Patient Details  Name: Jocelyn Schultz MRN: [id number] Date of Birth: 1931/03/20  Clinical Social Work is seeking post-discharge placement for this patient at the Skilled  Nursing Facility level of care (*CSW will initial, date and re-position this form in  chart as items are completed):  Yes   Patient/family provided with [place] Clinical Social Work Department's list of facilities offering this level of care within the geographic area requested by the patient (or if unable, by the patient's family).  Yes   Patient/family informed of their freedom to choose among providers that offer the needed level of care, that participate in Medicare, Medicaid or managed care program needed by the patient, have an available bed and are willing to accept the patient.  Yes   Patient/family informed of [facility]'s ownership interest in [facility] and [facility], as well as of the fact that they are under no obligation to receive care at these facilities.  PASRR submitted to EDS on 04/02/15     PASRR number received on 04/02/15     Existing PASRR number confirmed on       FL2 transmitted to all facilities in geographic area requested by pt/family on 04/02/15     FL2 transmitted to all facilities within larger geographic area on       Patient informed that his/her managed care company has contracts with or will negotiate with certain facilities, including the following:   [facility])     Yes   Patient/family informed of bed offers received.  Patient chooses bed at [facility]     Physician recommends and patient chooses bed at      Patient to be transferred to [facility] [place] on 04/03/15.  Patient to be transferred to facility by car with family    Patient family notified on 04/03/15 of transfer.  Name of family member notified:  Claris Che (daughter)     PHYSICIAN       Additional Comment:     _______________________________________________ Sharol Harness, Theresia Majors [phone]

## 2015-04-03 NOTE — Discharge Summary (Signed)
[facility]  Physician Discharge Summary   Patient ID: Jocelyn Schultz MRN: [id number] DOB/AGE: 1931/01/28 81 y.o.  Admit date: 03/31/2015 Discharge date: 04/03/2015  PCP: Ailene Ravel, MD  DISCHARGE DIAGNOSES:  Principal Problem:   Multifocal atrial tachycardia Active Problems:   Diabetes   CHF (congestive heart failure)   HTN (hypertension)   Hypomagnesemia   Hypokalemia   Syncope   RECOMMENDATIONS FOR OUTPATIENT FOLLOW UP: 1. BMEt and Mg next week.  2. Cardiology to arrange outpatient follow-up and stress test  DISCHARGE CONDITION: fair  Diet recommendation: Modified carbohydrate  Filed Weights   04/01/15 0310 04/02/15 0431 04/03/15 0458  Weight: 62.143 kg (137 lb) 62.097 kg (136 lb 14.4 oz) 62.052 kg (136 lb 12.8 oz)    INITIAL HISTORY: 81yo presented with complaints of dizziness and falls. Possible syncopal episode. She was found to have MAT with profoundly low K and Mg that were corrected. Cardiology consulted with plans for outpt event monitor.   Consultations:  Cardiology  Procedures: 2-D echocardiogram Study Conclusions - Left ventricle: The cavity size was normal. Wall thickness wasnormal. Systolic function was mildly reduced. The estimatedejection fraction was in the range of 45% to 50%. Diffusehypokinesis. - Mitral valve: Calcified annulus. - Left atrium: The atrium was moderately dilated. - Right ventricle: The cavity size was mildly dilated. Wallthickness was normal. - Right atrium: The atrium was mildly dilated.  HOSPITAL COURSE:   Multifocal Atrial Tachycardia in the setting of near syncope and questionable syncopal episodes Patient was admitted to the hospital. Cardiology was consulted. Patient was started on a beta blocker. She was also noted to have significant electrolyte abnormalities with hypokalemia and hypomagnesemia. These were aggressively repleted. She was monitored on telemetry. She did not have any further episodes of  arrhythmia. TSH was normal. Echocardiogram did reveal low ejection fraction with diffuse hypokinesis. BNP is normal. She is already on ACE inhibitor. Cardiology to arrange outpatient follow-up and stress test.   Hypomagnesemia/hypokalemia Was aggressively repleted. Continue oral magnesium. Labs to be repeated early next week. Keep potassium level close to 4.0.  DM2, without complications Patient was continued on her home medication regimen. Blood sugars are reasonably well controlled.   Essential HTN Blood pressure has been stable. Amlodipine has been changed over to metoprolol.   Loose Stools Likely due to magnesium and some of the other medications including acarbose. Should she denies any abdominal pain. Her abdomen is benign. Give Imodium. Low suspicion for infectious etiology.  Patient seen by physical and occupational therapy. She is noted to be deconditioned. Skilled nursing facility for rehabilitation is recommended. Patient and family agreeable. Okay for discharge today.   PERTINENT LABS:  The results of significant diagnostics from this hospitalization (including imaging, microbiology, ancillary and laboratory) are listed below for reference.     Labs: Basic Metabolic Panel:  Recent Labs Lab 03/31/15 2258 04/01/15 0428 04/01/15 0850 04/02/15 0337 04/03/15 0407  NA 138 137  --  136 136  K 3.0* 3.4* 4.0 3.8 3.9  CL 95* 97*  --  99* 101  CO2 30 32  --  31 29  GLUCOSE 138* 102*  --  82 81  BUN 10 9  --  8 9  CREATININE 0.90 0.82  --  0.84 0.83  CALCIUM 10.1 9.4  --  9.2 9.0  MG 0.9* 1.4* 1.8 1.4* 1.7   Liver Function Tests:  Recent Labs Lab 03/31/15 2258  AST 21  ALT 13*  ALKPHOS 57  BILITOT 0.6  PROT 7.7  ALBUMIN 3.4*   CBC:  Recent Labs Lab 03/31/15 2258 04/01/15 0428  WBC 7.6 6.6  NEUTROABS 4.5  --   HGB 12.5 11.7*  HCT 38.5 35.8*  MCV 86.7 85.9  PLT 261 234   Cardiac Enzymes:  Recent Labs Lab 04/01/15 0317 04/01/15 0428 04/01/15 0850  04/01/15 1420  TROPONINI <0.03 <0.03 <0.03 <0.03   BNP: BNP (last 3 results)  Recent Labs  04/01/15 0226 04/01/15 0428  BNP 85.7 99.5    CBG:  Recent Labs Lab 04/02/15 1616 04/02/15 1657 04/02/15 2023 04/03/15 0030 04/03/15 0422  GLUCAP 75 78 113* 90 93     IMAGING STUDIES Dg Chest 2 View  04/01/2015   CLINICAL DATA:  81 year old female with dizziness and fall. No chest complaints.  EXAM: CHEST  2 VIEW  COMPARISON:  Radiograph dated 01/06/2009  FINDINGS: The heart size and mediastinal contours are within normal limits. Both lungs are clear. Osteopenia no acute fracture.  IMPRESSION: No active cardiopulmonary disease.   Electronically Signed   By: Elgie Collard M.D.   On: 04/01/2015 00:09   Ct Head Wo Contrast  04/01/2015   CLINICAL DATA:  Multiple falls today with dizziness in generalize weakness.  EXAM: CT HEAD WITHOUT CONTRAST  TECHNIQUE: Contiguous axial images were obtained from the base of the skull through the vertex without intravenous contrast.  COMPARISON:  None.  FINDINGS: Mild diffuse cerebral atrophy. Low-attenuation changes throughout the deep white matter consistent with small vessel ischemia. Old lacunar infarcts in the left thalamus. No mass effect or midline shift. No abnormal extra-axial fluid collections. Gray-white matter junctions are distinct. Basal cisterns are not effaced. No evidence of acute intracranial hemorrhage. No depressed skull fractures. Visualized paranasal sinuses and mastoid air cells are not opacified. Vascular calcifications.  IMPRESSION: No acute intracranial abnormalities. Chronic atrophy and small vessel ischemic changes.   Electronically Signed   By: Burman Nieves M.D.   On: 04/01/2015 00:17    DISCHARGE EXAMINATION: Filed Vitals:   04/02/15 1400 04/02/15 2025 04/03/15 0458 04/03/15 1041  BP: 139/59 136/49 143/83 132/47  Pulse: 61 65 57 67  Temp: 97.6 F (36.4 C) 98.1 F (36.7 C) 97.4 F (36.3 C)   TempSrc: Oral Oral Oral     Resp: Height:      Weight:   62.052 kg (136 lb 12.8 oz)   SpO2: 97% 96% 95%    General appearance: alert, cooperative, appears stated age and no distress Resp: clear to auscultation bilaterally Cardio: regular rate and rhythm, S1, S2 normal, no murmur, click, rub or gallop GI: soft, non-tender; bowel sounds normal; no masses,  no organomegaly Extremities: extremities normal, atraumatic, no cyanosis or edema Neurologic: No focal deficits  DISPOSITION: SNF  Discharge Instructions    Call MD for:  difficulty breathing, headache or visual disturbances    Complete by:  As directed      Call MD for:  extreme fatigue    Complete by:  As directed      Call MD for:  persistant dizziness or light-headedness    Complete by:  As directed      Call MD for:  persistant nausea and vomiting    Complete by:  As directed      Call MD for:  temperature >100.4    Complete by:  As directed      Diet Carb Modified    Complete by:  As directed      Discharge instructions  Complete by:  As directed   BASIC metabolic panel including magnesium levels to be checked early next week. Cardiology to arrange outpatient follow-up and stress test.  You were cared for by a hospitalist during your hospital stay. If you have any questions about your discharge medications or the care you received while you were in the hospital after you are discharged, you can call the unit and asked to speak with the hospitalist on call if the hospitalist that took care of you is not available. Once you are discharged, your primary care physician will handle any further medical issues. Please note that NO REFILLS for any discharge medications will be authorized once you are discharged, as it is imperative that you return to your primary care physician (or establish a relationship with a primary care physician if you do not have one) for your aftercare needs so that they can reassess your need for medications and monitor your  lab values. If you do not have a primary care physician, you can call [phone] for a physician referral.     Increase activity slowly    Complete by:  As directed            ALLERGIES:  Allergies  Allergen Reactions  . Aspirin Other (See Comments)    Other Reaction: mouth blisters  . Saccharin Other (See Comments)    Other Reaction: oral blisters with art. sweetn  . Soap Other (See Comments)    Other Reaction: dial-drys skin     Current Discharge Medication List    START taking these medications   Details  loperamide (IMODIUM) 2 MG capsule Take 1 capsule (2 mg total) by mouth as needed for diarrhea or loose stools. Qty: 30 capsule, Refills: 0    magnesium oxide (MAG-OX) 400 (241.3 MG) MG tablet Take 1 tablet (400 mg total) by mouth 2 (two) times daily. Qty: 60 tablet, Refills: 0    metoprolol tartrate (LOPRESSOR) 25 MG tablet Take 0.5 tablets (12.5 mg total) by mouth 2 (two) times daily.      CONTINUE these medications which have NOT CHANGED   Details  acarbose (PRECOSE) 25 MG tablet Take 25 mg by mouth 3 (three) times daily with meals.     aspirin EC 81 MG tablet Take 81 mg by mouth daily.    calcium-vitamin D (OSCAL WITH D) 500-200 MG-UNIT per tablet Take 1 tablet by mouth 2 (two) times daily.    Cyanocobalamin (VITAMIN B-12 SL) Place 1 tablet under the tongue daily.    ezetimibe-simvastatin (VYTORIN) 10-10 MG per tablet Take 1 tablet by mouth at bedtime.    ferrous sulfate 325 (65 FE) MG tablet Take 325 mg by mouth 2 (two) times daily with a meal.    insulin detemir (LEVEMIR) 100 UNIT/ML injection Inject 18 Units into the skin every morning.    lisinopril (PRINIVIL,ZESTRIL) 20 MG tablet Take 20 mg by mouth 2 (two) times daily.    metFORMIN (GLUCOPHAGE) 1000 MG tablet Take 1,000 mg by mouth 2 (two) times daily with a meal.     mirabegron ER (MYRBETRIQ) 50 MG TB24 tablet Take 50 mg by mouth daily.    moxifloxacin (VIGAMOX) 0.5 % ophthalmic solution Place 1 drop  into the right eye 4 (four) times daily.    naproxen sodium (ANAPROX) 220 MG tablet Take 220 mg by mouth 2 (two) times daily as needed (pain).    NEXIUM 40 MG capsule Take 40 mg by mouth every other day.  Potassium Gluconate 595 MG CAPS Take 1 capsule by mouth 2 (two) times daily.    prednisoLONE acetate (PRED FORTE) 1 % ophthalmic suspension Place 1 drop into the right eye 4 (four) times daily.     tamsulosin (FLOMAX) 0.4 MG CAPS capsule Take 0.4 mg by mouth daily.    traZODone (DESYREL) 50 MG tablet Take 50 mg by mouth at bedtime.    vitamin C (ASCORBIC ACID) 500 MG tablet Take 500 mg by mouth daily.      STOP taking these medications     amLODipine (NORVASC) 5 MG tablet      MAGNESIUM PO        Follow-up Information    Follow up with [facility], MD. Schedule an appointment as soon as possible for a visit on 04/10/2015.   Specialty:  Family Medicine   Why:  post hospitalization follow upAppt. @ 9;15am   Contact information:   Dr. Burnell Blanks [address] [place] [address] [phone]       TOTAL DISCHARGE TIME: 35 minutes  [facility]  [facility] Pager [phone]  04/03/2015, 11:12 AM

## 2015-04-03 NOTE — Discharge Instructions (Signed)
Hypomagnesemia °Magnesium is a common ion (mineral) in the body which is needed for metabolism. It is about how the body handles food and other chemical reactions necessary for life. Only about 2% of the magnesium in our body is found in the blood. When this is low, it is called hypomagnesemia. The blood will measure only a tiny amount of the magnesium in our body. When it is low in our blood, it does not mean that the whole body supply is low. The normal serum concentration ranges from 1.8-2.5 mEq/L. When the level gets to be less than 1.0 mEq/L, a number of problems begin to happen.  °CAUSES  °· Receiving intravenous fluids without magnesium replacement. °· Loss of magnesium from the bowel by nasogastric suction. °· Loss of magnesium from nausea and vomiting or severe diarrhea. Any of the inflammatory bowel conditions can cause this. °· Abuse of alcohol often leads to low serum magnesium. °· An inherited form of magnesium loss happens when the kidneys lose magnesium. This is called familial or primary hypomagnesemia. °· Some medications such as diuretics also cause the loss of magnesium. °SYMPTOMS  °These following problems are worse if the changes in magnesium levels come on suddenly. °· Tremor. °· Confusion. °· Muscle weakness. °· Oversensitive to sights and sounds. °· Sensitive reflexes. °· Depression. °· Muscular fibrillations. °· Overreactivity of the nerves. °· Irritability. °· Psychosis. °· Spasms of the hand muscles. °· Tetany (where the muscles go into uncontrollable spasms). °DIAGNOSIS  °This condition can be diagnosed by blood tests. °TREATMENT  °· In an emergency, magnesium can be given intravenously (by vein). °· If the condition is less worrisome, it can be corrected by diet. High levels of magnesium are found in green leafy vegetables, peas, beans, and nuts among other things. It can also be given through medications by mouth. °· If it is being caused by medications, changes can be made. °· If  alcohol is a problem, help is available if there are difficulties giving it up. °Document Released: 04/07/2005 Document Revised: 11/26/2013 Document Reviewed: 03/01/2008 °ExitCare® Patient Information ©2015 [employer]. This information is not intended to replace advice given to you by your health care provider. Make sure you discuss any questions you have with your health care provider. ° °Hypokalemia °Hypokalemia means that the amount of potassium in the blood is lower than normal. Potassium is a chemical, called an electrolyte, that helps regulate the amount of fluid in the body. It also stimulates muscle contraction and helps nerves function properly. Most of the body's potassium is inside of cells, and only a very small amount is in the blood. Because the amount in the blood is so small, minor changes can be life-threatening. °CAUSES °· Antibiotics. °· Diarrhea or vomiting. °· Using laxatives too much, which can cause diarrhea. °· Chronic kidney disease. °· Water pills (diuretics). °· Eating disorders (bulimia). °· Low magnesium level. °· Sweating a lot. °SIGNS AND SYMPTOMS °· Weakness. °· Constipation. °· Fatigue. °· Muscle cramps. °· Mental confusion. °· Skipped heartbeats or irregular heartbeat (palpitations). °· Tingling or numbness. °DIAGNOSIS  °Your health care provider can diagnose hypokalemia with blood tests. In addition to checking your potassium level, your health care provider may also check other lab tests. °TREATMENT °Hypokalemia can be treated with potassium supplements taken by mouth or adjustments in your current medicines. If your potassium level is very low, you may need to get potassium through a vein (IV) and be monitored in the hospital. A diet high in potassium is also helpful.   Foods high in potassium are: °· Nuts, such as peanuts and pistachios. °· Seeds, such as sunflower seeds and pumpkin seeds. °· Peas, lentils, and lima beans. °· Whole grain and bran cereals and breads. °· Fresh  fruit and vegetables, such as apricots, avocado, bananas, cantaloupe, kiwi, oranges, tomatoes, asparagus, and potatoes. °· Orange and tomato juices. °· Red meats. °· Fruit yogurt. °HOME CARE INSTRUCTIONS °· Take all medicines as prescribed by your health care provider. °· Maintain a healthy diet by including nutritious food, such as fruits, vegetables, nuts, whole grains, and lean meats. °· If you are taking a laxative, be sure to follow the directions on the label. °SEEK MEDICAL CARE IF: °· Your weakness gets worse. °· You feel your heart pounding or racing. °· You are vomiting or having diarrhea. °· You are diabetic and having trouble keeping your blood glucose in the normal range. °SEEK IMMEDIATE MEDICAL CARE IF: °· You have chest pain, shortness of breath, or dizziness. °· You are vomiting or having diarrhea for more than 2 days. °· You faint. °MAKE SURE YOU:  °· Understand these instructions. °· Will watch your condition. °· Will get help right away if you are not doing well or get worse. °Document Released: 07/12/2005 Document Revised: 05/02/2013 Document Reviewed: 01/12/2013 °ExitCare® Patient Information ©2015 [employer]. This information is not intended to replace advice given to you by your health care provider. Make sure you discuss any questions you have with your health care provider. ° °

## 2015-04-03 NOTE — Progress Notes (Signed)
Discharged to daughter with paperwork for [facility] via wheelchair.

## 2015-04-03 NOTE — Progress Notes (Signed)
IV removed per order. Records given to daughter to take to [facility]. Report called to [place] at [place] [facility].

## 2015-04-03 NOTE — Progress Notes (Signed)
BSW intern spoke with pt and pt daughter about discharging from the hospital today. They were aware and okay with discharge to [facility]. BSW have spoken with the facility and they are aware of her coming today. Discharge  packet completed and transportation is arranged. Nursing made aware. Social work signing off  [employer]  BSW intern  [phone]

## 2015-04-22 ENCOUNTER — Ambulatory Visit: Payer: Medicare Other | Admitting: Cardiology

## 2015-04-23 ENCOUNTER — Ambulatory Visit (INDEPENDENT_AMBULATORY_CARE_PROVIDER_SITE_OTHER): Payer: Medicare Other | Admitting: Physician Assistant

## 2015-04-23 ENCOUNTER — Ambulatory Visit (INDEPENDENT_AMBULATORY_CARE_PROVIDER_SITE_OTHER): Payer: Medicare Other

## 2015-04-23 ENCOUNTER — Encounter: Payer: Self-pay | Admitting: Physician Assistant

## 2015-04-23 VITALS — BP 124/56 | HR 62 | Ht 60.0 in | Wt 147.6 lb

## 2015-04-23 DIAGNOSIS — I471 Supraventricular tachycardia: Secondary | ICD-10-CM

## 2015-04-23 DIAGNOSIS — I519 Heart disease, unspecified: Secondary | ICD-10-CM | POA: Diagnosis not present

## 2015-04-23 NOTE — Progress Notes (Signed)
Cardiology Office Note   Date:  04/23/2015   ID:  Jocelyn Schultz, Jocelyn Schultz, 1932, MRN [id number]  PCP:  Ailene Ravel, MD  Cardiologist:  Dr Rosemarie Beath, PA-C   Chief Complaint  Patient presents with  . Hospitalization Follow-up    Falls,    History of Present Illness: Jocelyn Schultz is a 81 y.o. female with a history of poor memory, DM, and HTN. She was recently admitted for syncope and had an echocardiogram showing EF 45-50 percent. She was diagnosed with multifocal atrial tachycardia.    Jocelyn Schultz presents for follow-up  Jocelyn Schultz is currently living with her granddaughter and grandson. Her family is making sure that she will live with someone until she can be placed in a facility. She is no longer able to care for herself, remember to take her medications accurately or perform other tasks without assistance.  After her hospitalization, she went to a rehabilitation facility in [place]. Her medications were adjusted while she was there and she has been home about a week. Prior to discharge, she had labs done at the facility which showed a magnesium level of 1.5 and BUN, creatinine, and potassium within normal limits. Her hemoglobin A1c was 6.9, but has improved.  Since discharge from the hospital, she feels she has done pretty well. She does not feel any palpitations. Her pulse was slightly irregular when it was being checked but she was not aware of it. She has not had any more syncope. She has not fallen since she left the hospital but she is using a walker and is very weak. She requires assistance on a regular basis and it is still at high risk of falling. She recently tried to give herself an insulin shot in her fingertip, apparently confusing the fingerstick blood test and the insulin injection.  Today, she is awake and alert and oriented to name only. She no she is not at home but is not quite sure where she is. She does not know the date. She does not  remember any symptoms and denies chest pain, shortness of breath, orthopnea or PND. According to her sister who is with her today but does not live with her, she has not been complaining of anything since she went home.   Past Medical History  Diagnosis Date  . DM2 (diabetes mellitus, type 2)   . HTN (hypertension)   . Cataract   . Osteoporosis   . Multifocal atrial tachycardia     Past Surgical History  Procedure Laterality Date  . Knee surgery Left 2011  . Eye surgery  2011, 2012  . Foot surgery Left   . Ankle surgery Left   . Hand surgery Left   . Dilation and curettage, diagnostic / therapeutic      Current Outpatient Prescriptions  Medication Sig Dispense Refill  . acarbose (PRECOSE) 25 MG tablet Take 25 mg by mouth 3 (three) times daily with meals.     Marland Kitchen aspirin EC 81 MG tablet Take 81 mg by mouth daily.    . calcium-vitamin D (OSCAL WITH D) 500-200 MG-UNIT per tablet Take 1 tablet by mouth 2 (two) times daily.    . Carboxymethylcellul-Glycerin (REFRESH OPTIVE OP) Apply 1 drop to eye 2 (two) times daily.    . Cyanocobalamin (VITAMIN B-12 SL) Place 1 tablet under the tongue daily.    Marland Kitchen ezetimibe-simvastatin (VYTORIN) 10-10 MG per tablet Take 1 tablet by mouth at bedtime.    Marland Kitchen  ferrous sulfate 325 (65 FE) MG tablet Take 325 mg by mouth 2 (two) times daily with a meal.    . furosemide (LASIX) 40 MG tablet Take 1 tablet by mouth 2 (two) times daily.    . insulin detemir (LEVEMIR) 100 UNIT/ML injection Inject 18 Units into the skin every morning.    Marland Kitchen lisinopril (PRINIVIL,ZESTRIL) 20 MG tablet Take 20 mg by mouth 2 (two) times daily.    Marland Kitchen loperamide (IMODIUM) 2 MG capsule Take 1 capsule (2 mg total) by mouth as needed for diarrhea or loose stools. Schultz capsule 0  . magnesium oxide (MAG-OX) 400 (241.3 MG) MG tablet Take 1 tablet (400 mg total) by mouth 2 (two) times daily. 60 tablet 0  . metFORMIN (GLUCOPHAGE) 1000 MG tablet Take 1,000 mg by mouth 2 (two) times daily with a meal.       . metoprolol tartrate (LOPRESSOR) 25 MG tablet Take 0.5 tablets (12.5 mg total) by mouth 2 (two) times daily.    . mirabegron ER (MYRBETRIQ) 50 MG TB24 tablet Take 50 mg by mouth daily.    . naproxen sodium (ANAPROX) 220 MG tablet Take 220 mg by mouth 2 (two) times daily as needed (pain).    Marland Kitchen NEXIUM 40 MG capsule Take 40 mg by mouth every other day.     . Potassium Gluconate 595 MG CAPS Take 1 capsule by mouth 2 (two) times daily.    . prednisoLONE acetate (PRED FORTE) 1 % ophthalmic suspension Place 1 drop into the right eye 4 (four) times daily.     . tamsulosin (FLOMAX) 0.4 MG CAPS capsule Take 0.4 mg by mouth daily.    . traZODone (DESYREL) 50 MG tablet Take 50 mg by mouth at bedtime.    . vitamin C (ASCORBIC ACID) 500 MG tablet Take 500 mg by mouth daily.     No current facility-administered medications for this visit.    Allergies:   Aspirin; Saccharin; and Soap    Social History:  The patient  reports that she has never smoked. She does not have any smokeless tobacco history on file. She reports that she drinks alcohol. She reports that she uses illicit drugs about 5 times per week.   Family History:  The patient's family history includes Diabetes Mellitus II in her sister; Heart Problems in her brother; Heart attack in her father; Leukemia in her mother; Other in her brother.    ROS:  Please see the history of present illness. All other systems are reviewed and negative.    PHYSICAL EXAM: VS:  BP 124/56 mmHg  Pulse 62  Ht 5' (1.524 m)  Wt 147 lb 9.6 oz (66.951 kg)  BMI 28.83 kg/m2 , BMI Body mass index is 28.83 kg/(m^2). GEN: Well nourished, well developed, elderly female in no acute distress HEENT: normal for age Neck: no JVD, no carotid bruits, or masses Cardiac: Slightly irregular; soft murmurs, no rubs, or gallops,no edema  Respiratory:  clear to auscultation bilaterally, normal work of breathing GI: soft, nontender, nondistended, + BS MS: no deformity or  atrophy Skin: warm and dry, no rash Neuro:  Strength and sensation are intact  EKG:  EKG is ordered today. The ekg ordered today demonstrates sinus rhythm with a brief episode of MAT   Recent Labs: 03/31/2015: ALT 13* 04/01/2015: B Natriuretic Peptide 99.5; Hemoglobin 11.7*; Platelets 234; TSH 1.923 04/03/2015: BUN 9; Creatinine, Ser 0.83; Magnesium 1.7; Potassium 3.9; Sodium 136    Lipid Panel    Component Value  Date/Time   CHOL 111 04/01/2015 0428   TRIG 84 04/01/2015 0428   HDL 37* 04/01/2015 0428   CHOLHDL 3.0 04/01/2015 0428   VLDL 17 04/01/2015 0428   LDLCALC 57 04/01/2015 0428     Wt Readings from Last 3 Encounters:  04/23/15 147 lb 9.6 oz (66.951 kg)  04/03/15 136 lb 12.8 oz (62.052 kg)     Other studies Reviewed: Additional studies/ records that were reviewed today include: Hospital records, office notes, echocardiogram and previous ECGs.  ASSESSMENT AND PLAN:  1.  MAT: Her EKG was read by the machine's atrial fibrillation but most of the beats appear sinus in origin and there is a brief 4 beat run of tachycardia which is regular. I believe this is an MAT. We will order an event monitor to determine the burden of her arrhythmia. Her resting heart rate is in the low 60s on a low dose of metoprolol at 12.5 mg twice a day. We will not increase this at this time. Her blood pressure is within normal limits.  2. Chronic systolic CHF: Her Lasix was increased to 40 mg twice a day at the facility. Her weight is elevated over previous weight, but she is not volume overloaded by exam. We will continue the Lasix at its current dose. We will check a BMET and a magnesium level when she comes in for her stress test.  3. Left ventricular dysfunction: It is unclear if this is ischemic or nonischemic cardiomyopathy. Although her EF is decreased, there are no regional wall motion abnormalities. We will obtain a Lexi scan Myoview. If this shows no ischemia, then this is possibly  tachycardia-induced cardiomyopathy and she will need another echo in 3 months to follow progress.   Current medicines are reviewed at length with the patient today.  The patient does not have concerns regarding medicines.  The following changes have been made:  no change  Labs/ tests ordered today include:  Orders Placed This Encounter  Procedures  . Myocardial Perfusion Imaging  . Cardiac event monitor     Disposition:   FU with Dr. Jens Som or myself in one month after the stress test.  Signed, Leanna Battles  04/23/2015 5:43 PM    [facility] [address] [place], [place], [place]  [address] Phone: [phone]; Fax: [phone]

## 2015-04-23 NOTE — Patient Instructions (Signed)
Your physician recommends that you schedule a follow-up appointment in: 1 month  Your physician has requested that you have a lexiscan myoview. For further information please visit [email or web address] Please follow instruction sheet, as given.  Your physician has recommended that you wear an event monitor. Event monitors are medical devices that record the heart's electrical activity. Doctors most often [place] these monitors to diagnose arrhythmias. Arrhythmias are problems with the speed or rhythm of the heartbeat. The monitor is a small, portable device. You can wear one while you do your normal daily activities. This is usually used to diagnose what is causing palpitations/syncope (passing out).

## 2015-04-24 ENCOUNTER — Encounter: Payer: Self-pay | Admitting: Physician Assistant

## 2015-04-29 ENCOUNTER — Telehealth (HOSPITAL_COMMUNITY): Payer: Self-pay

## 2015-04-29 NOTE — Telephone Encounter (Signed)
Encounter complete. 

## 2015-04-30 ENCOUNTER — Telehealth (HOSPITAL_COMMUNITY): Payer: Self-pay

## 2015-04-30 NOTE — Telephone Encounter (Signed)
Encounter complete. 

## 2015-05-01 ENCOUNTER — Ambulatory Visit (HOSPITAL_COMMUNITY)
Admission: RE | Admit: 2015-05-01 | Discharge: 2015-05-01 | Disposition: A | Discharge status: Home / Self Care | Payer: Medicare Other | Source: Ambulatory Visit | Attending: Internal Medicine | Admitting: Internal Medicine

## 2015-05-01 DIAGNOSIS — I519 Heart disease, unspecified: Secondary | ICD-10-CM | POA: Diagnosis not present

## 2015-05-01 DIAGNOSIS — I10 Essential (primary) hypertension: Secondary | ICD-10-CM | POA: Insufficient documentation

## 2015-05-01 DIAGNOSIS — E119 Type 2 diabetes mellitus without complications: Secondary | ICD-10-CM | POA: Diagnosis not present

## 2015-05-01 DIAGNOSIS — R42 Dizziness and giddiness: Secondary | ICD-10-CM | POA: Insufficient documentation

## 2015-05-01 DIAGNOSIS — I471 Supraventricular tachycardia: Secondary | ICD-10-CM | POA: Diagnosis present

## 2015-05-01 DIAGNOSIS — Z8249 Family history of ischemic heart disease and other diseases of the circulatory system: Secondary | ICD-10-CM | POA: Diagnosis not present

## 2015-05-01 DIAGNOSIS — R55 Syncope and collapse: Secondary | ICD-10-CM | POA: Insufficient documentation

## 2015-05-01 DIAGNOSIS — R5383 Other fatigue: Secondary | ICD-10-CM | POA: Insufficient documentation

## 2015-05-01 MED ORDER — REGADENOSON 0.4 MG/5ML IV SOLN
0.4000 mg | Freq: Once | INTRAVENOUS | Status: AC
  Administered 2015-05-01: 0.4 mg via INTRAVENOUS

## 2015-05-01 MED ORDER — TECHNETIUM TC 99M SESTAMIBI GENERIC - CARDIOLITE
32.2000 | Freq: Once | INTRAVENOUS | Status: AC | PRN
  Administered 2015-05-01: 32.2 via INTRAVENOUS

## 2015-05-01 MED ORDER — TECHNETIUM TC 99M SESTAMIBI GENERIC - CARDIOLITE
10.8000 | Freq: Once | INTRAVENOUS | Status: AC | PRN
  Administered 2015-05-01: 10.8 via INTRAVENOUS

## 2015-05-01 MED ORDER — AMINOPHYLLINE 25 MG/ML IV SOLN
75.0000 mg | Freq: Once | INTRAVENOUS | Status: AC
  Administered 2015-05-01: 75 mg via INTRAVENOUS

## 2015-05-02 LAB — MYOCARDIAL PERFUSION IMAGING
CHL CUP NUCLEAR SSS: 2
CSEPPHR: 82 {beats}/min
LVDIAVOL: 87 mL
LVSYSVOL: 36 mL
NUC STRESS TID: 1.09
Rest HR: 67 {beats}/min
SDS: 0
SRS: 2

## 2015-05-06 ENCOUNTER — Ambulatory Visit: Payer: Medicare Other | Admitting: Podiatry

## 2015-05-07 ENCOUNTER — Telehealth: Payer: Self-pay | Admitting: Physician Assistant

## 2015-05-07 NOTE — Telephone Encounter (Signed)
Returning a call from Jenna 

## 2015-05-07 NOTE — Telephone Encounter (Signed)
LM for Ilene to call back 

## 2015-05-08 NOTE — Telephone Encounter (Signed)
Spoke with Ilene - patient's sister and POA - and provided stress test results.

## 2015-05-08 NOTE — Telephone Encounter (Signed)
LM for Ilene to call back

## 2015-05-08 NOTE — Telephone Encounter (Signed)
Returning your call. °

## 2015-05-13 NOTE — Addendum Note (Signed)
Addended by: Neta EhlersRUITT, Reyden Smith M on: 05/13/2015 01:41 PM   Modules accepted: Orders

## 2015-05-22 ENCOUNTER — Encounter: Payer: Self-pay | Admitting: Physician Assistant

## 2015-05-22 ENCOUNTER — Ambulatory Visit (INDEPENDENT_AMBULATORY_CARE_PROVIDER_SITE_OTHER): Payer: Medicare Other | Admitting: Physician Assistant

## 2015-05-22 VITALS — BP 126/56 | HR 64 | Ht 60.0 in | Wt 147.3 lb

## 2015-05-22 DIAGNOSIS — R600 Localized edema: Secondary | ICD-10-CM

## 2015-05-22 DIAGNOSIS — Z79899 Other long term (current) drug therapy: Secondary | ICD-10-CM | POA: Diagnosis not present

## 2015-05-22 DIAGNOSIS — I471 Supraventricular tachycardia: Secondary | ICD-10-CM

## 2015-05-22 LAB — BASIC METABOLIC PANEL
BUN: 14 mg/dL (ref 7–25)
CHLORIDE: 97 mmol/L — AB (ref 98–110)
CO2: 34 mmol/L — ABNORMAL HIGH (ref 20–31)
CREATININE: 0.81 mg/dL (ref 0.60–0.88)
Calcium: 9.5 mg/dL (ref 8.6–10.4)
GLUCOSE: 184 mg/dL — AB (ref 65–99)
POTASSIUM: 3.8 mmol/L (ref 3.5–5.3)
Sodium: 140 mmol/L (ref 135–146)

## 2015-05-22 LAB — MAGNESIUM: Magnesium: 1.6 mg/dL (ref 1.5–2.5)

## 2015-05-22 NOTE — Patient Instructions (Signed)
Your physician recommends that you return for lab work TODAY.  Rhonda Barrett, PA-C, recommends that you schedule a follow-up appointment in 3 months with Dr Jens Somrenshaw.

## 2015-05-22 NOTE — Progress Notes (Signed)
Cardiology Office Note   Date:  05/22/2015   ID:  Kamora, Vossler Jan 15, 1931, MRN [id number]  PCP:  Maisie Fus, MD  Cardiologist:  Dr Rosemarie Beath, PA-C   Chief Complaint  Patient presents with  . Follow-up    test results    History of Present Illness: Jocelyn Schultz is a 81 y.o. female with a history of poor memory, DM, and HTN. Admitted for syncope and had an echocardiogram showing EF 45-50 percent, diagnosed with multifocal atrial tachycardia.   09/28 she was seen in the office and a MV plus event monitor were ordered.  Jocelyn Schultz presents for followup of the above testing.  She is currently living in a facility for rehabilitation. However, she is not safe to live alone and her insurance will run out and the next day or so. In order to keep her in the nursing home, she will need to go on Medicaid. Her sister is significantly concerned about financial issues.   [name] is not currently experiencing any palpitations. She denies shortness of breath. She has lower extremity edema, this improved to great deal when she was keeping her legs up more. According to her sister, the patient is not sleeping well, partly because patient's in nearby rooms are very noisy at night. Additionally, she has been getting up frequently in the night to urinate, and has been found walking the halls at times including the middle of the night, using her walker. [name] denies chest pain.  The patient states she feels tired and feels that she is not getting enough rest. She admits that she is not sleeping well. The patient also states she was given an injection in the back of her shoulder recently. No injection site is noted and there is nothing in her records to reflect anything given injectable recently except her insulin. Injection sites for the insulin are noted on her abdomen.   Past Medical History  Diagnosis Date  . DM2 (diabetes mellitus, type 2) (HCC)   . HTN  (hypertension)   . Cataract   . Osteoporosis   . Multifocal atrial tachycardia [facility])     Past Surgical History  Procedure Laterality Date  . Knee surgery Left 2011  . Eye surgery  2011, 2012  . Foot surgery Left   . Ankle surgery Left   . Hand surgery Left   . Dilation and curettage, diagnostic / therapeutic      Current Outpatient Prescriptions  Medication Sig Dispense Refill  . acarbose (PRECOSE) 25 MG tablet Take 25 mg by mouth 3 (three) times daily with meals.     Marland Kitchen aspirin EC 81 MG tablet Take 81 mg by mouth daily.    . calcium-vitamin D (OSCAL WITH D) 500-200 MG-UNIT per tablet Take 1 tablet by mouth 2 (two) times daily.    . Carboxymethylcellul-Glycerin (REFRESH OPTIVE OP) Apply 1 drop to eye 2 (two) times daily.    . Cyanocobalamin (VITAMIN B-12 SL) Place 1 tablet under the tongue daily.    Marland Kitchen ezetimibe-simvastatin (VYTORIN) 10-10 MG per tablet Take 1 tablet by mouth at bedtime.    . ferrous sulfate 325 (65 FE) MG tablet Take 325 mg by mouth 2 (two) times daily with a meal.    . furosemide (LASIX) 40 MG tablet Take 1 tablet by mouth 2 (two) times daily.    . insulin detemir (LEVEMIR) 100 UNIT/ML injection Inject 18 Units into the skin every morning.    Marland Kitchen  lisinopril (PRINIVIL,ZESTRIL) 20 MG tablet Take 20 mg by mouth 2 (two) times daily.    Marland Kitchen loperamide (IMODIUM) 2 MG capsule Take 1 capsule (2 mg total) by mouth as needed for diarrhea or loose stools. 30 capsule 0  . magnesium oxide (MAG-OX) 400 (241.3 MG) MG tablet Take 1 tablet (400 mg total) by mouth 2 (two) times daily. 60 tablet 0  . metFORMIN (GLUCOPHAGE) 1000 MG tablet Take 1,000 mg by mouth 2 (two) times daily with a meal.     . metoprolol tartrate (LOPRESSOR) 25 MG tablet Take 0.5 tablets (12.5 mg total) by mouth 2 (two) times daily.    . mirabegron ER (MYRBETRIQ) 50 MG TB24 tablet Take 50 mg by mouth daily.    . naproxen sodium (ANAPROX) 220 MG tablet Take 220 mg by mouth 2 (two) times daily as needed (pain).    Marland Kitchen  NEXIUM 40 MG capsule Take 40 mg by mouth every other day.     . Potassium Gluconate 595 MG CAPS Take 1 capsule by mouth 2 (two) times daily.    . prednisoLONE acetate (PRED FORTE) 1 % ophthalmic suspension Place 1 drop into the right eye 4 (four) times daily.     . tamsulosin (FLOMAX) 0.4 MG CAPS capsule Take 0.4 mg by mouth daily.    . traZODone (DESYREL) 50 MG tablet Take 50 mg by mouth at bedtime.    . vitamin C (ASCORBIC ACID) 500 MG tablet Take 500 mg by mouth daily.     No current facility-administered medications for this visit.    Allergies:   Aspirin; Saccharin; and Soap    Social History:  The patient  reports that she has never smoked. She does not have any smokeless tobacco history on file. She reports that she drinks alcohol occasionally on holidays. She does not use illicit drugs.  Family History:  The patient's family history includes Diabetes Mellitus II in her sister; Heart Problems in her brother; Heart attack in her father; Leukemia in her mother; Other in her brother.   ROS:  Please see the history of present illness. All other systems are reviewed and negative.   PHYSICAL EXAM: VS:  BP 126/56 mmHg  Pulse 64  Ht 5' (1.524 m)  Wt 147 lb 4.8 oz (66.815 kg)  BMI 28.77 kg/m2  SpO2 98% , BMI Body mass index is 28.77 kg/(m^2). GEN: Well nourished, well developed, female in no acute distress HEENT: normal for age  Neck: no JVD, no carotid bruit, no masses; negative hepatojugular reflux Cardiac: RRR; soft murmur, no rubs, or gallops Respiratory:  clear to auscultation bilaterally, normal work of breathing GI: soft, nontender, nondistended, + BS MS: no deformity or atrophy; no edema; distal pulses are 2+ in all 4 extremities  Skin: warm and dry, no rash Neuro:  Strength and sensation are intact  EKG:  EKG is not ordered today.  Recent Labs: 03/31/2015: ALT 13* 04/01/2015: B Natriuretic Peptide 99.5; Hemoglobin 11.7*; Platelets 234; TSH 1.923 04/03/2015: BUN 9;  Creatinine, Ser 0.83; Magnesium 1.7; Potassium 3.9; Sodium 136    Lipid Panel    Component Value Date/Time   CHOL 111 04/01/2015 0428   TRIG 84 04/01/2015 0428   HDL 37* 04/01/2015 0428   CHOLHDL 3.0 04/01/2015 0428   VLDL 17 04/01/2015 0428   LDLCALC 57 04/01/2015 0428     Wt Readings from Last 3 Encounters:  05/22/15 147 lb 4.8 oz (66.815 kg)  05/01/15 147 lb (66.679 kg)  04/23/15 147 lb  9.6 oz (66.951 kg)     Other studies Reviewed: Additional studies/ records that were reviewed today include: Hospital and office records, event monitor and stress testing.  ASSESSMENT AND PLAN:  1. MAT: Brief episodes of PAT and PACs were seen on the event monitor. Her resting heart rate is in the low 60s on a low dose of metoprolol at 12.5 mg twice a day. We will not increase this at this time. Her blood pressure is within normal limits.  2. Chronic systolic CHF: Her Lasix was increased to 40 mg twice a day at the facility. Her weight is elevated over previous weight, but she is not volume overloaded by exam and her weight has been stable over the past month. According to her daughter, she is eating more at the facility. We will continue the Lasix at its current dose but schedule it at 8 AM and 4 PM to limit nocturia. We will check a BMET and a magnesium level today.  3. Left ventricular dysfunction: Although her EF is decreased, there are no regional wall motion abnormalities on echo. 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 She can be followed clinically.  Current medicines are reviewed at length with the patient today.  The patient does not have concerns regarding medicines.  The following changes have been made:  no change 2 doses, timing has changed  Labs/ tests ordered today include:   Orders Placed This Encounter  Procedures  . Basic metabolic panel  . Magnesium     Disposition:   FU with Dr.  Jens Somrenshaw  Signed, Leanna BattlesBarrett, Saliou Barnier, PA-C  05/22/2015 4:41 PM    [facility] [address], [place], [place]  [address] Phone: [phone]; Fax: [phone]

## 2015-08-29 ENCOUNTER — Ambulatory Visit: Payer: Medicare Other | Admitting: Cardiology

## 2015-12-04 ENCOUNTER — Telehealth: Payer: Self-pay | Admitting: Cardiology

## 2015-12-04 NOTE — Telephone Encounter (Signed)
New message      Per pt request they are asking for the pt's records to faxed to them over for [facility]: [phone] fax number to [facility]. The pt is wanting to see a local cardiologist in [place].

## 2015-12-30 DIAGNOSIS — I429 Cardiomyopathy, unspecified: Secondary | ICD-10-CM | POA: Insufficient documentation

## 2017-03-02 ENCOUNTER — Ambulatory Visit (INDEPENDENT_AMBULATORY_CARE_PROVIDER_SITE_OTHER): Payer: Medicare Other | Admitting: Physician Assistant

## 2017-03-02 ENCOUNTER — Other Ambulatory Visit: Payer: Self-pay | Admitting: Physician Assistant

## 2017-03-02 ENCOUNTER — Encounter: Payer: Self-pay | Admitting: Physician Assistant

## 2017-03-02 VITALS — BP 116/68 | HR 134 | Ht 60.0 in | Wt 164.0 lb

## 2017-03-02 DIAGNOSIS — I5042 Chronic combined systolic (congestive) and diastolic (congestive) heart failure: Secondary | ICD-10-CM

## 2017-03-02 DIAGNOSIS — I4892 Unspecified atrial flutter: Secondary | ICD-10-CM

## 2017-03-02 MED ORDER — METOPROLOL TARTRATE 25 MG PO TABS: 25.0000 mg | ORAL_TABLET | Freq: Two times a day (BID) | ORAL | 5 refills | Status: DC

## 2017-03-02 NOTE — Patient Instructions (Signed)
Medication Instructions:  INCREASE METOPROLOL 25MG  TWICE DAILY If you need a refill on your cardiac medications before your next appointment, please call your pharmacy.  Follow-Up: Your physician wants you to follow-up in: 1ST AVAILABLE WITH DR CRENSHAW. MAKE NURSE APPT FOR EKG AND BP CHECK WITH NURSE ON Friday  Special Instructions: MAKE SURE TO HAVE PROTEIN SNACK AT BEDTIME  TAKE DAILY WEIGHTS AND FAX CHANGES TO [phone]  EKG AND BP CHECK WITH NURSE ON FRIDAY  2,000 MG LOW SODIUM DIET  Thank you for choosing [facility]!!      2,000MG  LOW SODIUM DIET (DASH Eating Plan) DASH stands for "Dietary Approaches to Stop Hypertension." The DASH eating plan is a healthy eating plan that has been shown to reduce high blood pressure (hypertension). It may also reduce your risk for type 2 diabetes, heart disease, and stroke. The DASH eating plan may also help with weight loss. What are tips for following this plan? General guidelines  Avoid eating more than 2,300 mg (milligrams) of salt (sodium) a day. If you have hypertension, you may need to reduce your sodium intake to 1,500 mg a day.  Limit alcohol intake to no more than 1 drink a day for nonpregnant women and 2 drinks a day for men. One drink equals 12 oz of beer, 5 oz of wine, or 1 oz of hard liquor.  Work with your health care provider to maintain a healthy body weight or to lose weight. Ask what an ideal weight is for you.  Get at least 30 minutes of exercise that causes your heart to beat faster (aerobic exercise) most days of the week. Activities may include walking, swimming, or biking.  Work with your health care provider or diet and nutrition specialist (dietitian) to adjust your eating plan to your individual calorie needs. Reading food labels  Check food labels for the amount of sodium per serving. Choose foods with less than 5 percent of the Daily Value of sodium. Generally, foods with less than 300  mg of sodium per serving fit into this eating plan.  To find whole grains, look for the word "whole" as the first word in the ingredient list. Shopping  Buy products labeled as "low-sodium" or "no salt added."  Buy fresh foods. Avoid canned foods and premade or frozen meals. Cooking  Avoid adding salt when cooking. Use salt-free seasonings or herbs instead of table salt or sea salt. Check with your health care provider or pharmacist before using salt substitutes.  Do not fry foods. Cook foods using healthy methods such as baking, boiling, grilling, and broiling instead.  Cook with heart-healthy oils, such as olive, canola, soybean, or sunflower oil. Meal planning   Eat a balanced diet that includes: ? 5 or more servings of fruits and vegetables each day. At each meal, try to fill half of your plate with fruits and vegetables. ? Up to 6-8 servings of whole grains each day. ? Less than 6 oz of lean meat, poultry, or fish each day. A 3-oz serving of meat is about the same size as a deck of cards. One egg equals 1 oz. ? 2 servings of low-fat dairy each day. ? A serving of nuts, seeds, or beans 5 times each week. ? Heart-healthy fats. Healthy fats called Omega-3 fatty acids are found in foods such as flaxseeds and coldwater fish, like sardines, salmon, and mackerel.  Limit how much you eat of the following: ? Canned or prepackaged foods. ? Food that is  high in trans fat, such as fried foods. ? Food that is high in saturated fat, such as fatty meat. ? Sweets, desserts, sugary drinks, and other foods with added sugar. ? Full-fat dairy products.  Do not salt foods before eating.  Try to eat at least 2 vegetarian meals each week.  Eat more home-cooked food and less restaurant, buffet, and fast food.  When eating at a restaurant, ask that your food be prepared with less salt or no salt, if possible. What foods are recommended? The items listed may not be a complete list. Talk with your  dietitian about what dietary choices are best for you. Grains Whole-grain or whole-wheat bread. Whole-grain or whole-wheat pasta. Brown rice. [place]. Bulgur. Whole-grain and low-sodium cereals. Pita bread. Low-fat, low-sodium crackers. Whole-wheat flour tortillas. Vegetables Fresh or frozen vegetables (raw, steamed, roasted, or grilled). Low-sodium or reduced-sodium tomato and vegetable juice. Low-sodium or reduced-sodium tomato sauce and tomato paste. Low-sodium or reduced-sodium canned vegetables. Fruits All fresh, dried, or frozen fruit. Canned fruit in natural juice (without added sugar). Meat and other protein foods Skinless chicken or [place]. Ground chicken or [place]. Pork with fat trimmed off. Fish and seafood. Egg whites. Dried beans, peas, or lentils. Unsalted nuts, nut butters, and seeds. Unsalted canned beans. Lean cuts of beef with fat trimmed off. Low-sodium, lean deli meat. Dairy Low-fat (1%) or fat-free (skim) milk. Fat-free, low-fat, or reduced-fat cheeses. Nonfat, low-sodium ricotta or cottage cheese. Low-fat or nonfat yogurt. Low-fat, low-sodium cheese. Fats and oils Soft margarine without trans fats. Vegetable oil. Low-fat, reduced-fat, or light mayonnaise and salad dressings (reduced-sodium). Canola, safflower, olive, soybean, and sunflower oils. Avocado. Seasoning and other foods Herbs. Spices. Seasoning mixes without salt. Unsalted popcorn and pretzels. Fat-free sweets. What foods are not recommended? The items listed may not be a complete list. Talk with your dietitian about what dietary choices are best for you. Grains Baked goods made with fat, such as croissants, muffins, or some breads. Dry pasta or rice meal packs. Vegetables Creamed or fried vegetables. Vegetables in a cheese sauce. Regular canned vegetables (not low-sodium or reduced-sodium). Regular canned tomato sauce and paste (not low-sodium or reduced-sodium). Regular tomato and vegetable juice (not  low-sodium or reduced-sodium). Angie Fava. Olives. Fruits Canned fruit in a light or heavy syrup. Fried fruit. Fruit in cream or butter sauce. Meat and other protein foods Fatty cuts of meat. Ribs. Fried meat. Berniece Salines. Sausage. Bologna and other processed lunch meats. Salami. Fatback. Hotdogs. Bratwurst. Salted nuts and seeds. Canned beans with added salt. Canned or smoked fish. Whole eggs or egg yolks. Chicken or [place] with skin. Dairy Whole or 2% milk, cream, and half-and-half. Whole or full-fat cream cheese. Whole-fat or sweetened yogurt. Full-fat cheese. Nondairy creamers. Whipped toppings. Processed cheese and cheese spreads. Fats and oils Butter. Stick margarine. Lard. Shortening. Ghee. Bacon fat. Tropical oils, such as coconut, palm kernel, or palm oil. Seasoning and other foods Salted popcorn and pretzels. Onion salt, garlic salt, seasoned salt, table salt, and sea salt. Worcestershire sauce. Tartar sauce. Barbecue sauce. Teriyaki sauce. Soy sauce, including reduced-sodium. Steak sauce. Canned and packaged gravies. Fish sauce. Oyster sauce. Cocktail sauce. Horseradish that you find on the shelf. Ketchup. Mustard. Meat flavorings and tenderizers. Bouillon cubes. Hot sauce and Tabasco sauce. Premade or packaged marinades. Premade or packaged taco seasonings. Relishes. Regular salad dressings. Where to find more information:  National Heart, Lung, and [place]: https://[place]-eaton.com/  American Heart Association: www.heart.org Summary  The DASH eating plan is a healthy eating  plan that has been shown to reduce high blood pressure (hypertension). It may also reduce your risk for type 2 diabetes, heart disease, and stroke.  With the DASH eating plan, you should limit salt (sodium) intake to 2,300 mg a day. If you have hypertension, you may need to reduce your sodium intake to 1,500 mg a day.  When on the DASH eating plan, aim to eat more fresh fruits and vegetables, whole grains, lean proteins,  low-fat dairy, and heart-healthy fats.  Work with your health care provider or diet and nutrition specialist (dietitian) to adjust your eating plan to your individual calorie needs. This information is not intended to replace advice given to you by your health care provider. Make sure you discuss any questions you have with your health care provider. Document Released: 07/01/2011 Document Revised: 07/05/2016 Document Reviewed: 07/05/2016 Elsevier Interactive Patient Education  2017 [employer].

## 2017-03-02 NOTE — Progress Notes (Signed)
Cardiology Office Note   Date:  03/04/2017   ID:  [name], [name] Aug 05, 1930, MRN [id number]  PCP:  System, Pcp Not In  Cardiologist:  Dr. Jens Som, 2016, Dr Dulce Sellar 07/2016 Jocelyn Demark, PA-C  05/22/2015  Chief Complaint  Patient presents with  . Follow-up    History of Present Illness: Jocelyn Schultz is a 83 y.o. female with a history of poor memory, DM, and HTN. Syncope 03/2015 w/ EF 45-50% at echo, diagnosed with multifocal atrial tachycardia.Dr Dulce Sellar lists atypical atrial flutter  Jocelyn Schultz presents for cardiology follow up.  She is in a SNF, her sister is with her today.  She never has palpitations. She does not get chest pain. She denies SOB. She occasionally gets daytime LE edema. She denies orthopnea and PND. Her sister supports these statements.   They were very surprised to learn that her HR is 134. She has no palpitations, no presyncope or syncope.   She was previously followed by Dr Dulce Sellar, but her sister moved her to a facility near here and wishes her to have a local cardiologist.    Past Medical History:  Diagnosis Date  . Cataract   . DM2 (diabetes mellitus, type 2) (HCC)   . HTN (hypertension)   . Multifocal atrial tachycardia (HCC)   . Osteoporosis     Past Surgical History:  Procedure Laterality Date  . ANKLE SURGERY Left   . DILATION AND CURETTAGE, DIAGNOSTIC / THERAPEUTIC    . EYE SURGERY  2011, 2012  . FOOT SURGERY Left   . HAND SURGERY Left   . KNEE SURGERY Left 2011    Medication Sig  . acarbose (PRECOSE) 25 MG tablet Take 25 mg by mouth 3 (three) times daily with meals.   Marland Kitchen acetaminophen (TYLENOL) 325 MG tablet Take 650 mg by mouth every 6 (six) hours as needed.  Marland Kitchen aspirin EC 81 MG tablet Take 81 mg by mouth daily.  . calcium-vitamin D (OSCAL WITH D) 500-200 MG-UNIT per tablet Take 1 tablet by mouth 2 (two) times daily.  . Carboxymethylcellul-Glycerin (REFRESH OPTIVE OP) Apply 1 drop to eye 2 (two) times daily.  .  Cholecalciferol (D3-50) 50000 units capsule Take 50,000 Units by mouth every 30 (thirty) days.  . Cyanocobalamin (VITAMIN B-12 SL) Place 1 tablet under the tongue daily.  Marland Kitchen ezetimibe-simvastatin (VYTORIN) 10-10 MG per tablet Take 1 tablet by mouth at bedtime.  . ferrous sulfate 325 (65 FE) MG tablet Take 325 mg by mouth 2 (two) times daily with a meal.  . guaifenesin (ROBITUSSIN) 100 MG/5ML syrup Take 200 mg by mouth 4 (four) times daily as needed for cough.  . hydrALAZINE (APRESOLINE) 50 MG tablet 1 tablet 3 (three) times daily.  . insulin detemir (LEVEMIR) 100 UNIT/ML injection Inject 44 Units into the skin every morning.   Marland Kitchen ipratropium-albuterol (DUONEB) 0.5-2.5 (3) MG/3ML SOLN Take 3 mLs by nebulization every 6 (six) hours as needed.  . loperamide (IMODIUM) 2 MG capsule Take 1 capsule (2 mg total) by mouth as needed for diarrhea or loose stools.  Marland Kitchen loratadine (CLARITIN) 10 MG tablet Take 10 mg by mouth daily.  . Magnesium Hydroxide (MILK OF MAGNESIA PO) Take 30 mLs by mouth as needed.  . magnesium oxide (MAG-OX) 400 (241.3 MG) MG tablet Take 1 tablet (400 mg total) by mouth 2 (two) times daily.  . Melatonin 3 MG TABS Take 1 tablet by mouth at bedtime.  . metoprolol tartrate (LOPRESSOR) 25 MG tablet Take  0.5 tablets (12.5 mg total) by mouth 2 (two) times daily.  . mirabegron ER (MYRBETRIQ) 50 MG TB24 tablet Take 50 mg by mouth daily.  Marland Kitchen. NEXIUM 40 MG capsule Take 40 mg by mouth every other day.   . ondansetron (ZOFRAN-ODT) 4 MG disintegrating tablet Take 4 mg by mouth every 8 (eight) hours as needed for nausea or vomiting.  Marland Kitchen. POLYETHYLENE GLYCOL 3350 PO Take 1 Dose by mouth daily.  . potassium chloride SA (K-DUR,KLOR-CON) 20 MEQ tablet Take 40 mEq by mouth daily.  . prednisoLONE acetate (PRED FORTE) 1 % ophthalmic suspension Place 1 drop into the right eye 4 (four) times daily.   . sertraline (ZOLOFT) 50 MG tablet Take 50 mg by mouth daily.  . tamsulosin (FLOMAX) 0.4 MG CAPS capsule Take 0.4  mg by mouth daily.  Marland Kitchen. torsemide (DEMADEX) 20 MG tablet Take 20 mg by mouth 2 (two) times daily.  . traMADol (ULTRAM) 50 MG tablet Take 50 mg by mouth every 6 (six) hours as needed.  . traZODone (DESYREL) 50 MG tablet Take 50 mg by mouth at bedtime.  . vitamin C (ASCORBIC ACID) 500 MG tablet Take 500 mg by mouth daily.  . zaleplon (SONATA) 5 MG capsule Take 5 mg by mouth at bedtime as needed for sleep.   No current facility-administered medications for this visit.     Allergies:   Aspirin; Saccharin; and Soap    Social History:  The patient  reports that she has never smoked. She has never used smokeless tobacco. She reports that she drinks alcohol. She reports that she does not use drugs.   Family History:  The patient's family history includes Diabetes Mellitus II in her sister; Heart Problems in her brother; Heart attack in her father; Leukemia in her mother; Other in her brother.    ROS:  Please see the history of present illness. All other systems are reviewed and negative.    PHYSICAL EXAM: VS:  BP 116/68   Pulse (!) 134   Ht 5' (1.524 m)   Wt 164 lb (74.4 kg)   BMI 32.03 kg/m  , BMI Body mass index is 32.03 kg/m. GEN: Well nourished, well developed, female in no acute distress  HEENT: normal for age  Neck: no JVD, no carotid bruit, no masses Cardiac: Irreg R&R; soft murmur, no rubs, or gallops Respiratory:  Decreased BS bases bilaterally, normal work of breathing GI: soft, nontender, nondistended, + BS MS: no deformity or atrophy; trace edema; distal pulses are 2+ in all 4 extremities   Skin: warm and dry, no rash Neuro:  Strength and sensation are intact Psych: euthymic mood, full affect   EKG:  EKG is ordered today. The ekg ordered today demonstrates atrial fluttler, RVR, HR 134. Repeat ECG w/ pt lying down, HR 80.    Recent Labs: 03/02/2017: BUN 32; Creatinine, Ser 1.67; Potassium 4.4; Sodium 142    Lipid Panel    Component Value Date/Time   CHOL 111  04/01/2015 0428   TRIG 84 04/01/2015 0428   HDL 37 (L) 04/01/2015 0428   CHOLHDL 3.0 04/01/2015 0428   VLDL 17 04/01/2015 0428   LDLCALC 57 04/01/2015 0428     Wt Readings from Last 3 Encounters:  03/02/17 164 lb (74.4 kg)  05/22/15 147 lb 4.8 oz (66.8 kg)  05/01/15 147 lb (66.7 kg)     Other studies Reviewed: Additional studies/ records that were reviewed today include: Care Everywhere records, hospital records.  ASSESSMENT AND PLAN:  1.  Atrial flutter RVR: Increase metoprolol to 25 mg bid and see how tolerated. She may need amio for rate control, BP may not tolerate much more BB. She is asymptomatic. If HR not well-controlled on higher dose of BB, DCCV in 3-4 weeks.  Return on Friday for ECG check.   2. Chronic ?systolic and diastolic CHF: Volume status is good. No sx. Continue Torsemide. F/u with Dr Jens Som and decide on echo recheck at that time. Check BMET. Daily weights.   3. DM: pt having low blood sugars during the night or first thing am. Pt needs protein snack at bedtime.    Current medicines are reviewed at length with the patient today.  The patient does not have concerns regarding medicines.  The following changes have been made:  Increase BB  Labs/ tests ordered today include:  No orders of the defined types were placed in this encounter.    Disposition:   FU with Dr Jens Som  Signed, Jocelyn Demark, PA-C  03/04/2017 10:22 AM    [facility] Phone: [phone]; Fax: [phone]  This note was written with the assistance of speech recognition software. Please excuse any transcriptional errors.

## 2017-03-03 LAB — BASIC METABOLIC PANEL
BUN / CREAT RATIO: 19 (ref 12–28)
BUN: 32 mg/dL — AB (ref 8–27)
CHLORIDE: 96 mmol/L (ref 96–106)
CO2: 30 mmol/L — ABNORMAL HIGH (ref 20–29)
Calcium: 9.9 mg/dL (ref 8.7–10.3)
Creatinine, Ser: 1.67 mg/dL — ABNORMAL HIGH (ref 0.57–1.00)
GFR calc Af Amer: 32 mL/min/{1.73_m2} — ABNORMAL LOW (ref >59)
GFR calc non Af Amer: 28 mL/min/{1.73_m2} — ABNORMAL LOW (ref >59)
GLUCOSE: 64 mg/dL — AB (ref 65–99)
POTASSIUM: 4.4 mmol/L (ref 3.5–5.2)
SODIUM: 142 mmol/L (ref 134–144)

## 2017-03-04 ENCOUNTER — Telehealth: Payer: Self-pay

## 2017-03-04 ENCOUNTER — Ambulatory Visit (INDEPENDENT_AMBULATORY_CARE_PROVIDER_SITE_OTHER): Payer: Medicaid Other

## 2017-03-04 VITALS — BP 130/60 | HR 130

## 2017-03-04 DIAGNOSIS — I509 Heart failure, unspecified: Secondary | ICD-10-CM | POA: Diagnosis not present

## 2017-03-04 DIAGNOSIS — I4892 Unspecified atrial flutter: Secondary | ICD-10-CM

## 2017-03-04 MED ORDER — APIXABAN 2.5 MG PO TABS: 2.5000 mg | ORAL_TABLET | Freq: Two times a day (BID) | ORAL | 2 refills | Status: DC

## 2017-03-04 MED ORDER — METOPROLOL TARTRATE 25 MG PO TABS: 37.5000 mg | ORAL_TABLET | Freq: Two times a day (BID) | ORAL | 5 refills | Status: DC

## 2017-03-04 NOTE — Telephone Encounter (Signed)
Aram Beechamynthia, nurse-@ [facility] notified to stop aspirin 81mg  because of starting Eliquis. Fill fax note for confirmation to SNF [phone]

## 2017-03-04 NOTE — Progress Notes (Addendum)
Pt here for ECG and BP check. ECG done and BP is WNL. Spoke with DOD-DR Tresa EndoKelly he states ok to start eliquis 2.5mg  twice daily, increase metoprolol 37.5mg  twice daily, hold torsemide today(03-04-17)and tomorrow(03-05-17) then restart torsemide 20mg  daily thereafter, decrease potassium 20Meq daily, and stop aspirin 81mg . CBC and BMP in one week. Will have to call nursing home and notify them to stop Aspirin 81mg . Faxed written orders to nursing home and called Aram BeechamCynthia to notify of aspirin change

## 2017-03-04 NOTE — Telephone Encounter (Signed)
error 

## 2017-03-10 NOTE — Addendum Note (Signed)
Addended by: Alyson InglesBROOME, MICHELLE L on: 03/10/2017 08:58 AM   Modules accepted: Orders

## 2017-04-13 ENCOUNTER — Other Ambulatory Visit (HOSPITAL_COMMUNITY): Payer: Self-pay | Admitting: Nurse Practitioner

## 2017-04-13 DIAGNOSIS — R519 Headache, unspecified: Secondary | ICD-10-CM

## 2017-04-13 DIAGNOSIS — R51 Headache: Principal | ICD-10-CM

## 2017-04-13 DIAGNOSIS — J3489 Other specified disorders of nose and nasal sinuses: Secondary | ICD-10-CM

## 2017-04-19 ENCOUNTER — Ambulatory Visit (HOSPITAL_COMMUNITY)
Admission: RE | Admit: 2017-04-19 | Discharge: 2017-04-19 | Disposition: A | Discharge status: Home / Self Care | Payer: Medicare Other | Source: Ambulatory Visit | Attending: Nurse Practitioner | Admitting: Nurse Practitioner

## 2017-04-19 DIAGNOSIS — R930 Abnormal findings on diagnostic imaging of skull and head, not elsewhere classified: Secondary | ICD-10-CM | POA: Insufficient documentation

## 2017-04-19 DIAGNOSIS — R519 Headache, unspecified: Secondary | ICD-10-CM

## 2017-04-19 DIAGNOSIS — R51 Headache: Secondary | ICD-10-CM | POA: Insufficient documentation

## 2017-04-19 DIAGNOSIS — J3489 Other specified disorders of nose and nasal sinuses: Secondary | ICD-10-CM

## 2017-04-22 ENCOUNTER — Other Ambulatory Visit: Payer: Self-pay | Admitting: General Surgery

## 2017-04-22 ENCOUNTER — Encounter (HOSPITAL_COMMUNITY): Payer: Self-pay | Admitting: *Deleted

## 2017-04-25 ENCOUNTER — Encounter (HOSPITAL_COMMUNITY): Payer: Self-pay | Admitting: *Deleted

## 2017-04-25 NOTE — Progress Notes (Signed)
Preop instructions for:   Jocelyn Schultz                   Date of Birth  -04-29-1931                          Date of Procedure: 04/29/2017       Doctor:Dr. Donell Beers  Time to arrive at [facility]:0800 am Report to: Admitting   Procedure: Right Temporal Artery Biopsy Any procedure time changes, MD office will notify you!   Do not eat or drink past midnight the night before your procedure.(To include any tube feedings-must be discontinued) Reminder:Follow bowel prep instructions per MD office!   Take these morning medications only with sips of water.(or give through gastrostomy or feeding tube).  Note: No Insulin or Diabetic meds should be given or taken the morning of the procedure!   Facility contact:                  Phone:                   Health Care POA: sister- Philis Nettle  [phone]  Transportation contact phone#: sister- Philis Nettle Phone- [phone]  Please send day of procedure:current med list and meds last taken that day, confirm nothing by mouth status from what time, Patient Demographic info( to include DNR status, problem list, allergies)   RN contact name/phone#:                             and Fax #:  Bring Insurance card and picture ID Leave all jewelry and other valuables at place where living( no metal or rings to be worn) No contact lens Women-no make-up, no lotions,perfumes,powders Men-no colognes,lotions  Any questions day of procedure,call [facility]  unit-[phone]!   Sent from :[facility]                   [phone]                   Fax:[phone]  Sent by : Telford Nab.Cathlean Sauer

## 2017-04-26 ENCOUNTER — Encounter (HOSPITAL_COMMUNITY): Payer: Self-pay | Admitting: *Deleted

## 2017-04-26 NOTE — Progress Notes (Addendum)
Preop instructions for: Jocelyn Schultz                         Date of Birth     1931/02/02                        Date of Procedure:   04/29/2017     Doctor:Byerly  Time to arrive at [facility]:0800 Report to: Admitting  Procedure: Right Temporal Artery Biopsy  Any procedure time changes, MD office will notify you!   Do not eat or drink past midnight the night before your procedure.(To include any tube feedings-must be discontinued)    Take these morning medications only with sips of water.(or give through gastrostomy or feeding tube). Hydralazine, Eye drops, Metoprolol, esomeprazole, sertraline, , myrbetriq  Note:  Levemir Insulin- Am of Surgery - Give 1/2 of am dose of Levemir Insulin am of surgery    Facility contact:  [facility]                  Phone:  [phone]                Health Care HQI:[name]-sister -[phone]  Transportation contact phone#: sister- [phone]  Please send day of procedure:current med list and meds last taken that day, confirm nothing by mouth status from what time, Patient Demographic info( to include DNR status, problem list, allergies)   RN contact name/phone#:   [facility] [phone]                          and Fax #: [phone]  Bring Insurance card and picture ID Leave all jewelry and other valuables at place where living( no metal or rings to be worn) No contact lens Women-no make-up, no lotions,perfumes,powders Men-no colognes,lotions     Sent from :[facility]                   Phone:[phone]                   Fax:[phone]  Sent by :  Cyndia Diver RN

## 2017-04-27 NOTE — H&P (Signed)
Jocelyn Schultz 04/22/2017 12:01 PM Location: [facility] Patient #: [id number] DOB: 02-28-1931 Divorced / Language: [place] / Race: White Female   History of Present Illness Jocelyn Lint MD; 04/22/2017 1:02 PM) The patient is a 83 year old female who presents with a complaint of headaches.. Patient is an 83 year old female referred for evaluation and consideration of temporal artery biopsy. She has had chronic right-sided headaches now for around a month. They never completely go away. She has tried a course of steroids and has not improved. She also reports some visual changes. Sedimentation rate was obtained and was significantly elevated. She has seen ophthalmology who has already placed her on prednisone. He is recommending a temporal artery biopsy.   Allergies (Jocelyn Schultz, RMA; 04/22/2017 12:02 PM) No Known Drug Allergies 09/23/2014 Allergies Reconciled   Medication History (Jocelyn Schultz, RMA; 04/22/2017 12:02 PM) LORazepam (  Tablet, Oral) Active. TraZODone HCl (  Tablet, Oral) Active. NexIUM (  Capsule DR, Oral) Active. Tamsulosin HCl (0.4MG  Capsule, Oral) Active. Myrbetriq (  Tablet ER 24HR, Oral) Active. Azo-Standard (  Tablet, Oral) Active. Vitamin D3 Active. Ferrous Sulfate (325 (65 Fe)MG Tablet, Oral two times daily) Active. PrednisoLONE Acetate (1% Suspension, Ophthalmic) Active. Vigamox (0.5% Solution, Ophthalmic) Active. Aspirin EC (  Tablet DR, Oral) Active. Vytorin (10-10MG  Tablet, Oral) Active. Insulin Syringe (1cc/29G) Active. Levemir FlexTouch (100UNIT/ML Soln Pen-inj, Subcutaneous) Active. Acarbose (  Tablet, Oral) Active. Potassium Gluconate (2.5MEQ Tablet, Oral) Active. NexIUM (  Packet, Oral) Active. AmLODIPine Besylate (  Tablet, Oral two times daily) Active. MetFORMIN HCl (  Tablet, Oral two times daily) Active. Zestril (  Tablet, Oral two times daily) Active. Medications  Reconciled    Review of Systems Jocelyn Lint MD; 04/22/2017 1:02 PM) All other systems negative  Vitals (Jocelyn Schultz RMA; 04/22/2017 12:02 PM) 04/22/2017 12:01 PM Weight: 180 lb Height: 60in Body Surface Area: 1.78 m Body Mass Index: 35.15 kg/m  Temp.: 97.67F  Pulse: 97 (Regular)  BP: 126/74 (Sitting, Left Arm, Standard)       Physical Exam Jocelyn Lint MD; 04/22/2017 1:03 PM) General Mental Status-Alert. General Appearance-Consistent with stated age. Hydration-Well hydrated. Voice-Normal.  Head and Neck Head-normocephalic, atraumatic with no lesions or palpable masses. Trachea-midline. Thyroid Gland Characteristics - normal size and consistency. Note: palpable right temporal artery pulse. slight facial droop on right.   Eye Eyeball - Bilateral-Extraocular movements intact. Sclera/Conjunctiva - Bilateral-No scleral icterus.  Chest and Lung Exam Chest and lung exam reveals -quiet, even and easy respiratory effort with no use of accessory muscles and on auscultation, normal breath sounds, no adventitious sounds and normal vocal resonance. Inspection Chest Wall - Normal. Back - normal.  Cardiovascular Cardiovascular examination reveals -normal heart sounds, regular rate and rhythm with no murmurs and normal pedal pulses bilaterally.  Abdomen Inspection Inspection of the abdomen reveals - No Hernias. Palpation/Percussion Palpation and Percussion of the abdomen reveal - Soft, Non Tender, No Rebound tenderness, No Rigidity (guarding) and No hepatosplenomegaly. Auscultation Auscultation of the abdomen reveals - Bowel sounds normal.  Neurologic Neurologic evaluation reveals -alert and oriented x 3 with no impairment of recent or remote memory. Mental Status-Normal.  Musculoskeletal Global Assessment -Note: no gross deformities.  Normal Exam - Left-Upper Extremity Strength Normal and Lower Extremity Strength  Normal. Normal Exam - Right-Upper Extremity Strength Normal and Lower Extremity Strength Normal.  Lymphatic Head & Neck  General Head & Neck Lymphatics: Bilateral - Description - Normal. Axillary  General Axillary Region: Bilateral - Description - Normal. Tenderness - Non Tender. Femoral & Inguinal  Generalized  Femoral & Inguinal Lymphatics: Bilateral - Description - No Generalized lymphadenopathy.    Assessment & Plan Jocelyn Lint MD; 04/22/2017 1:03 PM) RIGHT-SIDED HEADACHE (R51) Impression: Patient does have symptoms that could potentially be concerning for temporal arteritis. We will plan to do a temporal artery biopsy. She will need to hold her Eliquis. I think that she is reasonable for conscious sedation with local anesthetic. I reviewed the risks of the procedure with the patient and her daughter. I discussed risk of nerve damage bleeding, infection, and her lung complications. She understands and wishes to proceed. ELEVATED SED RATE (R70.0) Current Plans Schedule for Surgery   Signed by Jocelyn Lint, MD (04/22/2017 1:04 PM)

## 2017-04-29 ENCOUNTER — Encounter (HOSPITAL_COMMUNITY): Payer: Self-pay | Admitting: *Deleted

## 2017-04-29 ENCOUNTER — Ambulatory Visit (HOSPITAL_COMMUNITY)
Admission: RE | Admit: 2017-04-29 | Discharge: 2017-04-29 | Disposition: A | Discharge status: Home / Self Care | Payer: Medicare Other | Source: Ambulatory Visit | Attending: General Surgery | Admitting: General Surgery

## 2017-04-29 ENCOUNTER — Encounter (HOSPITAL_COMMUNITY)
Admission: RE | Disposition: A | Discharge status: Home / Self Care | Payer: Self-pay | Source: Ambulatory Visit | Attending: General Surgery

## 2017-04-29 ENCOUNTER — Ambulatory Visit (HOSPITAL_COMMUNITY): Payer: Medicare Other | Admitting: Certified Registered Nurse Anesthetist

## 2017-04-29 DIAGNOSIS — K219 Gastro-esophageal reflux disease without esophagitis: Secondary | ICD-10-CM | POA: Insufficient documentation

## 2017-04-29 DIAGNOSIS — I509 Heart failure, unspecified: Secondary | ICD-10-CM | POA: Diagnosis not present

## 2017-04-29 DIAGNOSIS — F0390 Unspecified dementia without behavioral disturbance: Secondary | ICD-10-CM | POA: Insufficient documentation

## 2017-04-29 DIAGNOSIS — Z7982 Long term (current) use of aspirin: Secondary | ICD-10-CM | POA: Diagnosis not present

## 2017-04-29 DIAGNOSIS — Z794 Long term (current) use of insulin: Secondary | ICD-10-CM | POA: Diagnosis not present

## 2017-04-29 DIAGNOSIS — R51 Headache: Secondary | ICD-10-CM | POA: Diagnosis not present

## 2017-04-29 DIAGNOSIS — E119 Type 2 diabetes mellitus without complications: Secondary | ICD-10-CM | POA: Insufficient documentation

## 2017-04-29 DIAGNOSIS — I708 Atherosclerosis of other arteries: Secondary | ICD-10-CM | POA: Diagnosis not present

## 2017-04-29 DIAGNOSIS — I110 Hypertensive heart disease with heart failure: Secondary | ICD-10-CM | POA: Diagnosis not present

## 2017-04-29 DIAGNOSIS — H539 Unspecified visual disturbance: Secondary | ICD-10-CM | POA: Diagnosis not present

## 2017-04-29 DIAGNOSIS — J449 Chronic obstructive pulmonary disease, unspecified: Secondary | ICD-10-CM | POA: Diagnosis not present

## 2017-04-29 DIAGNOSIS — Z79899 Other long term (current) drug therapy: Secondary | ICD-10-CM | POA: Insufficient documentation

## 2017-04-29 HISTORY — DX: Hypokalemia: E87.6

## 2017-04-29 HISTORY — DX: Overactive bladder: N32.81

## 2017-04-29 HISTORY — DX: Gastro-esophageal reflux disease without esophagitis: K21.9

## 2017-04-29 HISTORY — DX: Major depressive disorder, single episode, unspecified: F32.9

## 2017-04-29 HISTORY — DX: Unspecified glaucoma: H40.9

## 2017-04-29 HISTORY — DX: Unspecified asthma, uncomplicated: J45.909

## 2017-04-29 HISTORY — DX: Cardiac arrhythmia, unspecified: I49.9

## 2017-04-29 HISTORY — DX: Dry eye syndrome of unspecified lacrimal gland: H04.129

## 2017-04-29 HISTORY — DX: Dyspnea, unspecified: R06.00

## 2017-04-29 HISTORY — DX: Age-related physical debility: R54

## 2017-04-29 HISTORY — DX: Chronic obstructive pulmonary disease, unspecified: J44.9

## 2017-04-29 HISTORY — DX: Dry beriberi: E51.11

## 2017-04-29 HISTORY — DX: Depression, unspecified: F32.A

## 2017-04-29 HISTORY — DX: Constipation, unspecified: K59.00

## 2017-04-29 HISTORY — DX: Adverse effect of unspecified anesthetic, initial encounter: T41.45XA

## 2017-04-29 HISTORY — DX: Unspecified dementia, unspecified severity, without behavioral disturbance, psychotic disturbance, mood disturbance, and anxiety: F03.90

## 2017-04-29 HISTORY — DX: Hyperlipidemia, unspecified: E78.5

## 2017-04-29 HISTORY — DX: Allergic rhinitis, unspecified: J30.9

## 2017-04-29 HISTORY — DX: Vitamin B deficiency, unspecified: E53.9

## 2017-04-29 HISTORY — DX: Acute bronchitis, unspecified: J20.9

## 2017-04-29 HISTORY — DX: Other complications of anesthesia, initial encounter: T88.59XA

## 2017-04-29 HISTORY — PX: ARTERY BIOPSY: SHX891

## 2017-04-29 HISTORY — DX: Insomnia, unspecified: G47.00

## 2017-04-29 HISTORY — DX: Anemia, unspecified: D64.9

## 2017-04-29 LAB — GLUCOSE, CAPILLARY
GLUCOSE-CAPILLARY: 174 mg/dL — AB (ref 65–99)
GLUCOSE-CAPILLARY: 83 mg/dL (ref 65–99)
Glucose-Capillary: 37 mg/dL — CL (ref 65–99)
Glucose-Capillary: 80 mg/dL (ref 65–99)
Glucose-Capillary: 53 mg/dL — ABNORMAL LOW (ref 65–99)
Glucose-Capillary: 88 mg/dL (ref 65–99)

## 2017-04-29 SURGERY — BIOPSY TEMPORAL ARTERY
Anesthesia: Monitor Anesthesia Care | Laterality: Right

## 2017-04-29 MED ORDER — OXYCODONE HCL 5 MG PO TABS: 2.5000 mg | ORAL_TABLET | Freq: Four times a day (QID) | ORAL | 0 refills | Status: DC | PRN

## 2017-04-29 MED ORDER — DEXTROSE 50 % IV SOLN
25.0000 mL | Freq: Once | INTRAVENOUS | Status: AC
  Administered 2017-04-29: 25 mL via INTRAVENOUS

## 2017-04-29 MED ORDER — FENTANYL CITRATE (PF) 100 MCG/2ML IJ SOLN
INTRAMUSCULAR | Status: AC
  Filled 2017-04-29: qty 2

## 2017-04-29 MED ORDER — DEXTROSE 50 % IV SOLN
1.0000 | Freq: Once | INTRAVENOUS | Status: AC
  Administered 2017-04-29: 50 mL via INTRAVENOUS

## 2017-04-29 MED ORDER — APIXABAN 2.5 MG PO TABS
ORAL_TABLET | ORAL | 2 refills | Status: DC
Start: 2017-04-29 — End: 2018-03-07

## 2017-04-29 MED ORDER — BUPIVACAINE HCL 0.25 % IJ SOLN
INTRAMUSCULAR | Status: AC
  Filled 2017-04-29: qty 1

## 2017-04-29 MED ORDER — DEXTROSE 50 % IV SOLN
INTRAVENOUS | Status: AC
  Filled 2017-04-29: qty 50

## 2017-04-29 MED ORDER — BUPIVACAINE-EPINEPHRINE 0.25% -1:200000 IJ SOLN
INTRAMUSCULAR | Status: DC | PRN
  Administered 2017-04-29: 50 mL

## 2017-04-29 MED ORDER — FENTANYL CITRATE (PF) 100 MCG/2ML IJ SOLN
25.0000 ug | INTRAMUSCULAR | Status: DC | PRN
Start: 2017-04-29 — End: 2017-04-29

## 2017-04-29 MED ORDER — LIDOCAINE HCL 2 % IJ SOLN
INTRAMUSCULAR | Status: AC
  Filled 2017-04-29: qty 20

## 2017-04-29 MED ORDER — FENTANYL CITRATE (PF) 100 MCG/2ML IJ SOLN
INTRAMUSCULAR | Status: DC | PRN
  Administered 2017-04-29: 25 ug via INTRAVENOUS

## 2017-04-29 MED ORDER — ONDANSETRON HCL 4 MG/2ML IJ SOLN: 4.0000 mg | Freq: Once | INTRAMUSCULAR | Status: DC | PRN

## 2017-04-29 MED ORDER — LACTATED RINGERS IV SOLN
INTRAVENOUS | Status: DC
  Administered 2017-04-29: 08:00:00 via INTRAVENOUS

## 2017-04-29 MED ORDER — CHLORHEXIDINE GLUCONATE CLOTH 2 % EX PADS: 6.0000 | MEDICATED_PAD | Freq: Once | CUTANEOUS | Status: DC

## 2017-04-29 MED ORDER — CEFAZOLIN SODIUM-DEXTROSE 2-4 GM/100ML-% IV SOLN
2.0000 g | INTRAVENOUS | Status: AC
  Administered 2017-04-29: 2 g via INTRAVENOUS
  Filled 2017-04-29: qty 100

## 2017-04-29 MED ORDER — BUPIVACAINE-EPINEPHRINE (PF) 0.25% -1:200000 IJ SOLN
INTRAMUSCULAR | Status: AC
  Filled 2017-04-29: qty 30

## 2017-04-29 MED ORDER — PROPOFOL 500 MG/50ML IV EMUL
INTRAVENOUS | Status: DC | PRN
  Administered 2017-04-29: 100 ug/kg/min via INTRAVENOUS

## 2017-04-29 MED ORDER — LIDOCAINE HCL (PF) 1 % IJ SOLN
INTRAMUSCULAR | Status: DC | PRN
  Administered 2017-04-29: 30 mL

## 2017-04-29 SURGICAL SUPPLY — 35 items
ADH SKN CLS APL DERMABOND .7 (GAUZE/BANDAGES/DRESSINGS) ×1
BANDAGE ACE 6X5 VEL STRL LF (GAUZE/BANDAGES/DRESSINGS) IMPLANT
BLADE SURG 15 STRL LF DISP TIS (BLADE) ×1 IMPLANT
BLADE SURG 15 STRL SS (BLADE) ×3
CHLORAPREP W/TINT 26ML (MISCELLANEOUS) ×3 IMPLANT
CLOSURE WOUND 1/2 X4 (GAUZE/BANDAGES/DRESSINGS)
COVER SURGICAL LIGHT HANDLE (MISCELLANEOUS) ×3 IMPLANT
DECANTER SPIKE VIAL GLASS SM (MISCELLANEOUS) ×3 IMPLANT
DERMABOND ADVANCED (GAUZE/BANDAGES/DRESSINGS) ×2
DERMABOND ADVANCED .7 DNX12 (GAUZE/BANDAGES/DRESSINGS) IMPLANT
DRAPE LAPAROTOMY T 98X78 PEDS (DRAPES) ×2 IMPLANT
DRAPE LAPAROTOMY TRNSV 102X78 (DRAPE) ×1 IMPLANT
ELECT PENCIL ROCKER SW 15FT (MISCELLANEOUS) ×3 IMPLANT
ELECT REM PT RETURN 15FT ADLT (MISCELLANEOUS) ×3 IMPLANT
GAUZE SPONGE 4X4 12PLY STRL (GAUZE/BANDAGES/DRESSINGS) ×3 IMPLANT
GLOVE BIO SURGEON STRL SZ 6 (GLOVE) ×3 IMPLANT
GLOVE INDICATOR 6.5 STRL GRN (GLOVE) ×3 IMPLANT
GOWN STRL REUS W/TWL 2XL LVL3 (GOWN DISPOSABLE) ×3 IMPLANT
GOWN STRL REUS W/TWL XL LVL3 (GOWN DISPOSABLE) ×3 IMPLANT
KIT BASIN OR (CUSTOM PROCEDURE TRAY) ×3 IMPLANT
NDL HYPO 25X1 1.5 SAFETY (NEEDLE) ×1 IMPLANT
NEEDLE HYPO 25X1 1.5 SAFETY (NEEDLE) ×3 IMPLANT
PACK BASIC VI WITH GOWN DISP (CUSTOM PROCEDURE TRAY) ×3 IMPLANT
SPONGE LAP 4X18 X RAY DECT (DISPOSABLE) ×3 IMPLANT
STRIP CLOSURE SKIN 1/2X4 (GAUZE/BANDAGES/DRESSINGS) IMPLANT
SUT MNCRL AB 4-0 PS2 18 (SUTURE) ×3 IMPLANT
SUT SILK 4-0 12X30IN (SUTURE) IMPLANT
SUT VIC AB 3-0 SH 27 (SUTURE)
SUT VIC AB 3-0 SH 27X BRD (SUTURE) IMPLANT
SUT VIC AB 3-0 SH 27XBRD (SUTURE) IMPLANT
SUT VIC AB 4-0 PS2 27 (SUTURE) IMPLANT
SYR CONTROL 10ML LL (SYRINGE) ×3 IMPLANT
TOWEL OR 17X26 10 PK STRL BLUE (TOWEL DISPOSABLE) ×3 IMPLANT
TOWEL OR NON WOVEN STRL DISP B (DISPOSABLE) ×3 IMPLANT
YANKAUER SUCT BULB TIP 10FT TU (MISCELLANEOUS) ×3 IMPLANT

## 2017-04-29 NOTE — Transfer of Care (Signed)
Immediate Anesthesia Transfer of Care Note  Patient: Jocelyn Schultz  Procedure(s) Performed: RIGHT TEMPORAL ARTERY BIOPSY (Right )  Patient Location: PACU  Anesthesia Type:MAC  Level of Consciousness: awake, alert  and patient cooperative  Airway & Oxygen Therapy: Patient Spontanous Breathing and Patient connected to face mask oxygen  Post-op Assessment: Report given to RN and Post -op Vital signs reviewed and stable  Post vital signs: Reviewed and stable  Last Vitals:  Vitals:   04/29/17 0825  BP: (!) 155/83  Pulse: 80  Resp: 18  Temp: 36.6 C  SpO2: 99%    Last Pain:  Vitals:   04/29/17 0825  TempSrc: Oral      Patients Stated Pain Goal: 3 (34/[id number])  Complications: No apparent anesthesia complications

## 2017-04-29 NOTE — Anesthesia Preprocedure Evaluation (Addendum)
Anesthesia Evaluation  Patient identified by MRN, date of birth, ID band Patient awake    Reviewed: Allergy & Precautions, NPO status , Patient's Chart, lab work & pertinent test results, reviewed documented beta blocker date and time   Airway Mallampati: III  TM Distance: >3 FB Neck ROM: Full    Dental no notable dental hx.    Pulmonary asthma , COPD, former smoker,    Pulmonary exam normal breath sounds clear to auscultation       Cardiovascular hypertension, Pt. on medications and Pt. on home beta blockers +CHF  Normal cardiovascular exam+ dysrhythmias Atrial Fibrillation  Rhythm:Regular Rate:Normal  ECG: a-flutter  ECHO: LV EF: 45% -   50%  Sees cardiologist   Neuro/Psych  Headaches, PSYCHIATRIC DISORDERS Depression Dementia    GI/Hepatic Neg liver ROS, GERD  Medicated and Controlled,  Endo/Other  diabetes, Insulin Dependent  Renal/GU negative Renal ROS     Musculoskeletal negative musculoskeletal ROS (+)   Abdominal   Peds  Hematology  (+) anemia ,   Anesthesia Other Findings   Reproductive/Obstetrics                            Anesthesia Physical Anesthesia Plan  ASA: III  Anesthesia Plan: MAC   Post-op Pain Management:    Induction: Intravenous  PONV Risk Score and Plan: 2 and Propofol infusion  Airway Management Planned: Natural Airway  Additional Equipment:   Intra-op Plan:   Post-operative Plan:   Informed Consent: I have reviewed the patients History and Physical, chart, labs and discussed the procedure including the risks, benefits and alternatives for the proposed anesthesia with the patient or authorized representative who has indicated his/her understanding and acceptance.   Dental advisory given  Plan Discussed with: CRNA  Anesthesia Plan Comments:        Anesthesia Quick Evaluation

## 2017-04-29 NOTE — Progress Notes (Signed)
Vital signs stable. CBG 88. OK to discharge to [facility] nursing facility per Dr. Noreene Larsson.

## 2017-04-29 NOTE — Progress Notes (Signed)
Prescription for Oxycodone 5 mg  #10 given to replace the one Dr. Donell Beers wrote earlier.  Rx has been lost between PACU and short stay, Pt OK to start Eliquis tomorrow afternoon.  I have personally reviewed the patients medication history on the [place] controlled substance database.

## 2017-04-29 NOTE — Progress Notes (Signed)
Ms. Jocelyn Schultz CBG checked upon arrival with result of 31. Rechecked a few minutes later with result of 37. Patient very lethargic and able to respond with tapping of shoulder. Notified Dr. Kipp Brood. Received orders for 1 ampule of Dextrose 50%. CBG checked after 15 minutes of administration with result of 174. Patient alert and oriented x4. Will continue to monitor.

## 2017-04-29 NOTE — Op Note (Signed)
PRE-OPERATIVE DIAGNOSIS: headaches, visual changes, concern for temporal arteritis  POST-OPERATIVE DIAGNOSIS:  Same  PROCEDURE:  Procedure(s): Right temporal artery biopsy  SURGEON:  Surgeon(s): Stark Klein, MD  ANESTHESIA:   local and MAC   FINDINGS:  Artery slightly posteriorly located.    LOCAL MEDICATIONS USED:  BUPIVICAINE  and LIDOCAINE   SPECIMEN:  Source of Specimen:  Right temporal artery segment  DISPOSITION OF SPECIMEN:  PATHOLOGY  COUNTS:  YES  PLAN OF CARE: Discharge to home after PACU  PATIENT DISPOSITION:  PACU - hemodynamically stable.   PROCEDURE:   The patient was identified in the holding area, taken to the operating room, and was placed supine on the operating room table.  Bilateral temporal arteries were traced out with the Doppler. The side with the strongest signal was selected which was the right side.    The hair was clipped and the lateral upper face was prepped and draped in sterile fashion.  Timeout was performed according to the surgical safety checklist. When all was correct, we continued.  Local anesthetic was infiltrated in the skin above the temporal artery.  A longitudinal incision proximally 3 cm was made with the #15 blade.  The temporal artery was skeletonized with the small right angle clamp. Care was taken to avoid the branches of the facial nerve. Side branches of the temporal artery were ligated with 4-0 silk ties  A 2 cm segment of the artery was taken. The main temporal artery was ligated with 3-0 silk ties.  Hemostasis was achieved with cautery.    The incision was closed with 3-0 vicryl interrupted deep dermal sutures and 4-0 monocryl running subcuticular suture.  The wound was cleaned, dried, and dressed with dermabond.    Needle, sponge, and instrument counts were correct.  Pt emerged from anesthesia and was taken to PACU in stable condition.

## 2017-04-29 NOTE — Discharge Instructions (Addendum)
[facility] °Office Phone Number [phone] ° ° POST OP INSTRUCTIONS ° °Always review your discharge instruction sheet given to you by the facility where your surgery was performed. ° °IF YOU HAVE DISABILITY OR FAMILY LEAVE FORMS, YOU MUST BRING THEM TO THE OFFICE FOR PROCESSING.  DO NOT GIVE THEM TO YOUR DOCTOR. ° °1. A prescription for pain medication may be given to you upon discharge.  Take your pain medication as prescribed, if needed.  If narcotic pain medicine is not needed, then you may take acetaminophen (Tylenol) or ibuprofen (Advil) as needed. °2. Take your usually prescribed medications unless otherwise directed °3. If you need a refill on your pain medication, please contact your pharmacy.  They will contact our office to request authorization.  Prescriptions will not be filled after 5pm or on week-ends. °4. You should eat very light the first 24 hours after surgery, such as soup, crackers, pudding, etc.  Resume your normal diet the day after surgery °5. It is common to experience some constipation if taking pain medication after surgery.  Increasing fluid intake and taking a stool softener will usually help or prevent this problem from occurring.  A mild laxative (Milk of Magnesia or Miralax) should be taken according to package directions if there are no bowel movements after 48 hours. °6. You may shower in 48 hours.  The surgical glue will flake off in 2-3 weeks.   °7. ACTIVITIES:  No strenuous activity or heavy lifting for 1 week.   °a. You may drive when you no longer are taking prescription pain medication, you can comfortably wear a seatbelt, and you can safely maneuver your car and apply brakes. °b. RETURN TO WORK:  __________n/a_______________ °You should see your doctor in the office for a follow-up appointment approximately three-four weeks after your surgery.   ° °WHEN TO CALL YOUR DOCTOR: °1. Fever over 101.0 °2. Nausea and/or vomiting. °3. Extreme swelling or  bruising. °4. Continued bleeding from incision. °5. Increased pain, redness, or drainage from the incision. ° °The clinic staff is available to answer your questions during regular business hours.  Please don’t hesitate to call and ask to speak to one of the nurses for clinical concerns.  If you have a medical emergency, go to the nearest emergency room or call 911.  A surgeon from [facility] is always on call at the hospital. ° °For further questions, please visit [email or web address]  ° ° °General Anesthesia, Adult, Care After °These instructions provide you with information about caring for yourself after your procedure. Your health care provider may also give you more specific instructions. Your treatment has been planned according to current medical practices, but problems sometimes occur. Call your health care provider if you have any problems or questions after your procedure. °What can I expect after the procedure? °After the procedure, it is common to have: °· Vomiting. °· A sore throat. °· Mental slowness. ° °It is common to feel: °· Nauseous. °· Cold or shivery. °· Sleepy. °· Tired. °· Sore or achy, even in parts of your body where you did not have surgery. ° °Follow these instructions at home: °For at least 24 hours after the procedure: °· Do not: °? Participate in activities where you could fall or become injured. °? Drive. °? Use heavy machinery. °? Drink alcohol. °? Take sleeping pills or medicines that cause drowsiness. °? Make important decisions or sign legal documents. °? Take care of children on your own. °· Rest. °Eating and drinking °·   If you vomit, drink water, juice, or soup when you can drink without vomiting. °· Drink enough fluid to keep your urine clear or pale yellow. °· Make sure you have little or no nausea before eating solid foods. °· Follow the diet recommended by your health care provider. °General instructions °· Have a responsible adult stay with you until you  are awake and alert. °· Return to your normal activities as told by your health care provider. Ask your health care provider what activities are safe for you. °· Take over-the-counter and prescription medicines only as told by your health care provider. °· If you smoke, do not smoke without supervision. °· Keep all follow-up visits as told by your health care provider. This is important. °Contact a health care provider if: °· You continue to have nausea or vomiting at home, and medicines are not helpful. °· You cannot drink fluids or start eating again. °· You cannot urinate after 8-12 hours. °· You develop a skin rash. °· You have fever. °· You have increasing redness at the site of your procedure. °Get help right away if: °· You have difficulty breathing. °· You have chest pain. °· You have unexpected bleeding. °· You feel that you are having a life-threatening or urgent problem. °This information is not intended to replace advice given to you by your health care provider. Make sure you discuss any questions you have with your health care provider. °Document Released: 10/18/2000 Document Revised: 12/15/2015 Document Reviewed: 06/26/2015 °Elsevier Interactive Patient Education © 2018 [employer] ° ° °

## 2017-04-29 NOTE — Interval H&P Note (Signed)
History and Physical Interval Note:  04/29/2017 10:00 AM  Jocelyn Schultz  has presented today for surgery, with the diagnosis of Right headaches.  The various methods of treatment have been discussed with the patient and family. After consideration of risks, benefits and other options for treatment, the patient has consented to  Procedure(s): RIGHT TEMPORAL ARTERY BIOPSY (Right) as a surgical intervention .  The patient's history has been reviewed, patient examined, no change in status, stable for surgery.  I have reviewed the patient's chart and labs.  Questions were answered to the patient's satisfaction.     Jodeci Roarty

## 2017-05-02 LAB — GLUCOSE, CAPILLARY: GLUCOSE-CAPILLARY: 31 mg/dL — AB (ref 65–99)

## 2017-05-02 NOTE — Anesthesia Postprocedure Evaluation (Signed)
Anesthesia Post Note  Patient: Jocelyn Schultz  Procedure(s) Performed: RIGHT TEMPORAL ARTERY BIOPSY (Right )     Patient location during evaluation: PACU Anesthesia Type: MAC Level of consciousness: awake and alert Pain management: pain level controlled Vital Signs Assessment: post-procedure vital signs reviewed and stable Respiratory status: spontaneous breathing, nonlabored ventilation, respiratory function stable and patient connected to nasal cannula oxygen Cardiovascular status: stable and blood pressure returned to baseline Postop Assessment: no apparent nausea or vomiting Anesthetic complications: no    Last Vitals:  Vitals:   04/29/17 1312 04/29/17 1402  BP: (!) 157/59 (!) 157/70  Pulse: (!) 51 70  Resp: 16 16  Temp: 36.6 C 36.6 C  SpO2: 93% 97%    Last Pain:  Vitals:   04/29/17 1402  TempSrc: Oral  PainSc:                  Ryan P Ellender

## 2017-05-03 NOTE — Progress Notes (Signed)
Please let patient and family know that biopsy is negative for temporal arteritis.  Also please fax to referring MD.

## 2017-05-16 NOTE — Progress Notes (Signed)
HPI: Follow-up MAT/atrial flutter. Echocardiogram September 2016 showed ejection fraction 45-50%, biatrial enlargement and mild right ventricular enlargement. Carotid Dopplers September 2016 showed 1-39% bilateral stenosis. Monitor in September 2016 showed sinus rhythm with PACs, brief PAT and PVCs. Nuclear study October 2016 showed ejection fraction 59% and normal perfusion. Also with chronic diastolic congestive heart failure. Seen 03/02/2017 and was noted to be in asymptomatic atypical atrial flutter. Beta blocker increased. Pt was not anticoagulated. Since last seen, patient has had increased edema which has improved with higher dose diuretic. She denies dyspnea, chest pain, palpitations or syncope. No bleeding.  Current Outpatient Prescriptions  Medication Sig Dispense Refill  . acarbose (PRECOSE) 25 MG tablet Take 25 mg by mouth 3 (three) times daily with meals. (0900, 1300, &1700)    . apixaban (ELIQUIS) 2.5 MG TABS tablet Resume this drug tomorrow afternoon.  Take it just like you did before surgery. 60 tablet 2  . brimonidine (ALPHAGAN) 0.2 % ophthalmic solution Place 1 drop into both eyes 2 (two) times daily. (0900 & 2100)    . calcium-vitamin D (OSCAL WITH D) 500-200 MG-UNIT per tablet Take 1 tablet by mouth 2 (two) times daily. (0900 & 2100)    . Carboxymethylcellul-Glycerin (REFRESH OPTIVE OP) Place 1 drop into both eyes 2 (two) times daily. (0900 & 2100)    . esomeprazole (NEXIUM) 40 MG capsule Take 40 mg by mouth daily at 12 noon.    . ezetimibe-simvastatin (VYTORIN) 10-10 MG per tablet Take 1 tablet by mouth at bedtime. (2100)    . ferrous sulfate 325 (65 FE) MG tablet Take 325 mg by mouth every other day. (0900)    . hydrALAZINE (APRESOLINE) 50 MG tablet Take 50 mg by mouth 3 (three) times daily. (0900, 1300, & 2100)    . insulin detemir (LEVEMIR) 100 UNIT/ML injection Inject 12-44 Units into the skin 2 (two) times daily. 44 UNITS IN THE MORNING & 12 UNITS AT BEDTIME.    .  insulin lispro (HUMALOG) 100 UNIT/ML injection Inject 2-10 Units into the skin 3 (three) times daily before meals. (0630, 1130, & 1630) 150-200=2 UNITS, 201-250=4 UNITS, 251-300=6 UNITS, 301-350=8 UNITS, 351-400=10 UNITS, & >400 CALL NP    . magnesium oxide (MAG-OX) 400 (241.3 MG) MG tablet Take 1 tablet (400 mg total) by mouth 2 (two) times daily. (Patient taking differently: Take 400 mg by mouth 2 (two) times daily. (0900 & 2100)) 60 tablet 0  . Melatonin 3 MG TABS Take 6 mg by mouth at bedtime. (2100)    . metoprolol tartrate (LOPRESSOR) 25 MG tablet Take 1.5 tablets (37.5 mg total) by mouth 2 (two) times daily. Note dose increased (Patient taking differently: Take 37.5 mg by mouth 2 (two) times daily. (0900 & 2100)) 60 tablet 5  . mirabegron ER (MYRBETRIQ) 50 MG TB24 tablet Take 50 mg by mouth daily. (0900)    . NEXIUM 40 MG capsule Take 40 mg by mouth daily at 6 (six) AM. (0630)    . oxyCODONE (OXY IR/ROXICODONE) 5 MG immediate release tablet Take 0.5-1 tablets (2.5-5 mg total) by mouth every 6 (six) hours as needed for moderate pain, severe pain or breakthrough pain. 10 tablet 0  . POLYETHYLENE GLYCOL 3350 PO Take 17 g by mouth daily. (0900)    . potassium chloride SA (K-DUR,KLOR-CON) 20 MEQ tablet Take 20 mEq by mouth daily. (0900)    . prednisoLONE acetate (PRED FORTE) 1 % ophthalmic suspension Place 1 drop into the right eye 2 (  two) times daily. (0900 & 2100)    . predniSONE (DELTASONE) 20 MG tablet Take 40 mg by mouth daily with breakfast. (0900)    . sertraline (ZOLOFT) 50 MG tablet Take 50 mg by mouth daily. (0900)    . tamsulosin (FLOMAX) 0.4 MG CAPS capsule Take 0.4 mg by mouth daily. (0900)    . torsemide (DEMADEX) 20 MG tablet Take 20 mg by mouth daily. (0900)    . traMADol (ULTRAM) 50 MG tablet Take 50 mg by mouth every 6 (six) hours as needed (FOR PAIN.).     Marland Kitchen. vitamin B-12 (CYANOCOBALAMIN) 1000 MCG tablet Take 1,000 mcg by mouth every other day. (0900)    . vitamin C (ASCORBIC  ACID) 500 MG tablet Take 500 mg by mouth daily. (0900)    . Vitamin D, Ergocalciferol, (DRISDOL) 50000 units CAPS capsule Take 50,000 Units by mouth every 30 (thirty) days. (0900)    . zaleplon (SONATA) 5 MG capsule Take 5 mg by mouth at bedtime. (2100)     No current facility-administered medications for this visit.      Past Medical History:  Diagnosis Date  . Acute bronchitis    hx of   . Age-related physical debility   . Allergic rhinitis   . Anemia    iron deficiency per H7P from [facility]  . Asthma    per H&P from [facility]  . Cataract   . CHF (congestive heart failure) (HCC)    acute systolic (congestive)heart failure per H&P from [facility]  . Complication of anesthesia    per sister, states daughter said she is hard to wake up  . Constipation   . COPD (chronic obstructive pulmonary disease) (HCC)    per H&P from [facility]  . Dementia    from progress note from [facility]  . Depression    per H&P from [facility]  . DM2 (diabetes mellitus, type 2) (HCC)   . Dry beriberi   . Dry eye syndrome   . Dyspnea    per H&P from [facility]  . Dysrhythmia    h/o SVT  and atrial flutter from H&P from [facility]  . GERD (gastroesophageal reflux disease)    per H&P from [facility]  . Glaucoma   . Headache    from progress note from [facility]  . HTN (hypertension)    per H&P from [facility]  . Hyperlipidemia   . Hypokalemia    hx of   . Insomnia   . Multifocal atrial tachycardia (HCC)   . Osteoporosis   . Overactive bladder   . Vitamin B deficiency     Past Surgical History:  Procedure Laterality Date  . ANKLE SURGERY Left   . ARTERY BIOPSY Right 04/29/2017   Procedure: RIGHT TEMPORAL ARTERY BIOPSY;  Surgeon: Almond LintByerly, Faera, MD;  Location: [facility];  Service: General;  Laterality: Right;  . DILATION AND CURETTAGE OF UTERUS    . DILATION AND CURETTAGE,  DIAGNOSTIC / THERAPEUTIC    . EYE SURGERY  2011, 2012  . FOOT SURGERY Left   . HAND SURGERY Left   . KNEE SURGERY Left 2011    Social History   Social History  . Marital status: Divorced    Spouse name: N/A  . Number of children: N/A  . Years of education: N/A   Occupational History  . Not on file.   Social History Main  Topics  . Smoking status: Former Smoker    Types: Cigarettes    Quit date: 05/13/1987  . Smokeless tobacco: Never Used  . Alcohol use 0.0 oz/week     Comment: very rare (wine at christmas)  . Drug use: No  . Sexual activity: Not on file   Other Topics Concern  . Not on file   Social History Narrative  . No narrative on file    Family History  Problem Relation Age of Onset  . Leukemia Mother   . Heart attack Father   . Heart Problems Brother   . Other Brother        kidney problems  . Diabetes Mellitus II Sister     ROS: no fevers or chills, productive cough, hemoptysis, dysphasia, odynophagia, melena, hematochezia, dysuria, hematuria, rash, seizure activity, orthopnea, PND,  claudication. Remaining systems are negative.  Physical Exam: Well-developed obese in no acute distress.  Skin is warm and dry.  HEENT is normal.  Neck is supple.  Chest is clear to auscultation with normal expansion.  Cardiovascular exam is irregular, tachycardia Abdominal exam nontender or distended. No masses palpated. Extremities show 2+ edema. neuro grossly intact  ECG- Electrocardiogram today shows probable atrial flutter and low voltage; personally reviewed  A/P  1 atrial flutter-patient remains in atrial flutter today. Continue apixaban. Heart rate mildly elevated. Increase metoprolol to 50 mg twice a day for rate control. We discussed option of rate control versus rhythm control. She has had increased pedal edema recently but has had difficulties with chronic pedal edema. This has improved with higher dose diuretic. I will have her seen in approximately 6  weeks. If her edema is worsening it would be reasonable to try cardioversion to see if sinus rhythm with help her volume status. Otherwise it may be best to continue with rate control and anticoagulation. Schedule echocardiogram to assess LV function. Check TSH.  2 chronic diastolic congestive heart failure-she is mildly volume overloaded. Her diuretics were increased recently and she is improving. We will await follow-up laboratories concerning renal function.  3 chronic stage III kidney disease-we will await follow-up laboratories to be drawn at her nursing facility next week.  4 hypertension-blood pressure is controlled. Continue present medications.  5 diabetes mellitus-per primary care.  Olga Millers, MD

## 2017-05-24 ENCOUNTER — Encounter: Payer: Self-pay | Admitting: Cardiology

## 2017-05-24 ENCOUNTER — Ambulatory Visit (INDEPENDENT_AMBULATORY_CARE_PROVIDER_SITE_OTHER): Payer: Medicare Other | Admitting: Cardiology

## 2017-05-24 VITALS — BP 126/60 | HR 109 | Ht 60.0 in | Wt 181.6 lb

## 2017-05-24 DIAGNOSIS — I5032 Chronic diastolic (congestive) heart failure: Secondary | ICD-10-CM | POA: Diagnosis not present

## 2017-05-24 DIAGNOSIS — I10 Essential (primary) hypertension: Secondary | ICD-10-CM | POA: Diagnosis not present

## 2017-05-24 DIAGNOSIS — I484 Atypical atrial flutter: Secondary | ICD-10-CM | POA: Diagnosis not present

## 2017-05-24 MED ORDER — METOPROLOL TARTRATE 50 MG PO TABS: 50.0000 mg | ORAL_TABLET | Freq: Two times a day (BID) | ORAL | Status: AC

## 2017-05-24 NOTE — Patient Instructions (Signed)
Medication Instructions:   INCREASE METOPROLOL TO 50 MG TWICE DAILY  Labwork:  Your physician recommends that you FORWARD LAB RESULTS TO DR Jens SomRENSHAW  Testing/Procedures:  Your physician has requested that you have an echocardiogram. Echocardiography is a painless test that uses sound waves to create images of your heart. It provides your doctor with information about the size and shape of your heart and how well your heart's chambers and valves are working. This procedure takes approximately one hour. There are no restrictions for this procedure.    Follow-Up:  Your physician recommends that you schedule a follow-up appointment in: 6-8 WEEKS WITH APP  Your physician recommends that you schedule a follow-up appointment in: 3 MONTHS WITH DR Jens SomRENSHAW

## 2017-06-07 ENCOUNTER — Other Ambulatory Visit: Payer: Self-pay

## 2017-06-07 ENCOUNTER — Ambulatory Visit (HOSPITAL_COMMUNITY): Payer: Medicare Other | Attending: Cardiology

## 2017-06-07 DIAGNOSIS — I313 Pericardial effusion (noninflammatory): Secondary | ICD-10-CM | POA: Diagnosis not present

## 2017-06-07 DIAGNOSIS — Z87891 Personal history of nicotine dependence: Secondary | ICD-10-CM | POA: Diagnosis not present

## 2017-06-07 DIAGNOSIS — I081 Rheumatic disorders of both mitral and tricuspid valves: Secondary | ICD-10-CM | POA: Insufficient documentation

## 2017-06-07 DIAGNOSIS — I484 Atypical atrial flutter: Secondary | ICD-10-CM | POA: Diagnosis not present

## 2017-06-07 DIAGNOSIS — I509 Heart failure, unspecified: Secondary | ICD-10-CM | POA: Insufficient documentation

## 2017-06-07 DIAGNOSIS — I4891 Unspecified atrial fibrillation: Secondary | ICD-10-CM | POA: Insufficient documentation

## 2017-06-07 DIAGNOSIS — I110 Hypertensive heart disease with heart failure: Secondary | ICD-10-CM | POA: Diagnosis not present

## 2017-06-07 DIAGNOSIS — E119 Type 2 diabetes mellitus without complications: Secondary | ICD-10-CM | POA: Insufficient documentation

## 2017-06-08 ENCOUNTER — Telehealth: Payer: Self-pay | Admitting: *Deleted

## 2017-06-08 NOTE — Telephone Encounter (Addendum)
-----   Message from Lewayne BuntingBrian S Crenshaw, MD sent at 06/06/2017  5:11 PM EST ----- Cr now < 1.5; would increase apixaban to 5 mg BID Olga MillersBrian Crenshaw   Left message for pt to call to discuss echo and labs

## 2017-06-08 NOTE — Telephone Encounter (Signed)
Follow up     Pt is calling back, she has not heard anything regarding echo

## 2017-06-08 NOTE — Telephone Encounter (Signed)
Spoke with pt dtr, aware of change. Spoke with Tamela Gammonteresa lee at [facility] and verbal order given for increase in the eliquis to 5 mg twice daily.

## 2017-06-08 NOTE — Telephone Encounter (Signed)
F/u Message ° °Pt returning RN call  °

## 2017-07-05 NOTE — Progress Notes (Deleted)
Cardiology Office Note    Date:  07/05/2017   ID:  Rudene, Poulsen 01-Nov-1930, MRN [id number]  PCP:  Marden Noble, MD  Cardiologist: Dr. Jens Som   No chief complaint on file.   History of Present Illness:    Jocelyn Schultz is a 83 y.o. female with past medical history of paroxysmal atrial flutter, MAT, chronic diastolic CHF, HTN, HLD, Type 2 DM and COPD who presents to the office today for 6-week follow-up.   She was last examined by Dr. Jens Som on 05/24/2017 and reported having increased lower extremity edema at that time. She denied any recent dyspnea or chest discomfort. Her HR remained elevated in the low-100's, therefore Lopressor was increased to 50mg  BID. Torsemide dosing had recently been increased by her PCP, therefore dosing was not further adjusted at that time of her visit. An echocardiogram was obtained on 06/07/2017 and showed a preserved EF of 50-55%, no regional WMA, mild MR, moderate dilation of the LA, and a trivial pericardial effusion.     Past Medical History:  Diagnosis Date  . Acute bronchitis    hx of   . Age-related physical debility   . Allergic rhinitis   . Anemia    iron deficiency per H7P from [facility]  . Asthma    per H&P from [facility]  . Cataract   . CHF (congestive heart failure) (HCC)    acute systolic (congestive)heart failure per H&P from [facility]  . Complication of anesthesia    per sister, states daughter said she is hard to wake up  . Constipation   . COPD (chronic obstructive pulmonary disease) (HCC)    per H&P from [facility]  . Dementia    from progress note from [facility]  . Depression    per H&P from [facility]  . DM2 (diabetes mellitus, type 2) (HCC)   . Dry beriberi   . Dry eye syndrome   . Dyspnea    per H&P from [facility]  . Dysrhythmia    h/o SVT  and atrial flutter from H&P from [facility]  . GERD (gastroesophageal reflux  disease)    per H&P from [facility]  . Glaucoma   . Headache    from progress note from [facility]  . HTN (hypertension)    per H&P from [facility]  . Hyperlipidemia   . Hypokalemia    hx of   . Insomnia   . Multifocal atrial tachycardia (HCC)   . Osteoporosis   . Overactive bladder   . Vitamin B deficiency     Past Surgical History:  Procedure Laterality Date  . ANKLE SURGERY Left   . ARTERY BIOPSY Right 04/29/2017   Procedure: RIGHT TEMPORAL ARTERY BIOPSY;  Surgeon: [name], MD;  Location: [facility];  Service: General;  Laterality: Right;  . DILATION AND CURETTAGE OF UTERUS    . DILATION AND CURETTAGE, DIAGNOSTIC / THERAPEUTIC    . EYE SURGERY  2011, 2012  . FOOT SURGERY Left   . HAND SURGERY Left   . KNEE SURGERY Left 2011    Current Medications: Outpatient Medications Prior to Visit  Medication Sig Dispense Refill  . acarbose (PRECOSE) 25 MG tablet Take 25 mg by mouth 3 (three) times daily with meals. (0900, 1300, &1700)    . apixaban (ELIQUIS) 2.5 MG TABS tablet Resume this drug tomorrow afternoon.  Take it just like  you did before surgery. 60 tablet 2  . brimonidine (ALPHAGAN) 0.2 % ophthalmic solution Place 1 drop into both eyes 2 (two) times daily. (0900 & 2100)    . calcium-vitamin D (OSCAL WITH D) 500-200 MG-UNIT per tablet Take 1 tablet by mouth 2 (two) times daily. (0900 & 2100)    . Carboxymethylcellul-Glycerin (REFRESH OPTIVE OP) Place 1 drop into both eyes 2 (two) times daily. (0900 & 2100)    . ezetimibe-simvastatin (VYTORIN) 10-10 MG per tablet Take 1 tablet by mouth at bedtime. (2100)    . ferrous sulfate 325 (65 FE) MG tablet Take 325 mg by mouth every other day. (0900)    . hydrALAZINE (APRESOLINE) 50 MG tablet Take 50 mg by mouth 3 (three) times daily. (0900, 1300, & 2100)    . insulin detemir (LEVEMIR) 100 UNIT/ML injection Inject 12-44 Units into the skin 2 (two) times daily. 44 UNITS IN THE MORNING & 12 UNITS AT  BEDTIME.    . insulin lispro (HUMALOG) 100 UNIT/ML injection Inject 2-10 Units into the skin 3 (three) times daily before meals. (0630, 1130, & 1630) 150-200=2 UNITS, 201-250=4 UNITS, 251-300=6 UNITS, 301-350=8 UNITS, 351-400=10 UNITS, & >400 CALL NP    . magnesium oxide (MAG-OX) 400 (241.3 MG) MG tablet Take 1 tablet (400 mg total) by mouth 2 (two) times daily. (Patient taking differently: Take 400 mg by mouth 2 (two) times daily. (0900 & 2100)) 60 tablet 0  . Melatonin 3 MG TABS Take 6 mg by mouth at bedtime. (2100)    . metoprolol tartrate (LOPRESSOR) 50 MG tablet Take 1 tablet (50 mg total) by mouth 2 (two) times daily. Note dose increased    . mirabegron ER (MYRBETRIQ) 50 MG TB24 tablet Take 50 mg by mouth daily. (0900)    . oxyCODONE (OXY IR/ROXICODONE) 5 MG immediate release tablet Take 0.5-1 tablets (2.5-5 mg total) by mouth every 6 (six) hours as needed for moderate pain, severe pain or breakthrough pain. 10 tablet 0  . pantoprazole (PROTONIX) 40 MG tablet Take 40 mg by mouth daily.    Marland Kitchen. POLYETHYLENE GLYCOL 3350 PO Take 17 g by mouth daily. (0900)    . potassium chloride SA (K-DUR,KLOR-CON) 20 MEQ tablet Take 20 mEq by mouth daily. (0900)    . prednisoLONE acetate (PRED FORTE) 1 % ophthalmic suspension Place 1 drop into the right eye 2 (two) times daily. (0900 & 2100)    . predniSONE (DELTASONE) 20 MG tablet Take 40 mg by mouth daily with breakfast. (0900)    . sertraline (ZOLOFT) 50 MG tablet Take 50 mg by mouth daily. (0900)    . tamsulosin (FLOMAX) 0.4 MG CAPS capsule Take 0.4 mg by mouth daily. (0900)    . torsemide (DEMADEX) 20 MG tablet Take 40 mg by mouth daily. (0900)    . traMADol (ULTRAM) 50 MG tablet Take 50 mg by mouth every 6 (six) hours as needed (FOR PAIN.).     Marland Kitchen. vitamin B-12 (CYANOCOBALAMIN) 1000 MCG tablet Take 1,000 mcg by mouth every other day. (0900)    . vitamin C (ASCORBIC ACID) 500 MG tablet Take 500 mg by mouth daily. (0900)    . Vitamin D, Ergocalciferol,  (DRISDOL) 50000 units CAPS capsule Take 50,000 Units by mouth every 30 (thirty) days. (0900)    . zaleplon (SONATA) 5 MG capsule Take 5 mg by mouth at bedtime. (2100)     No facility-administered medications prior to visit.      Allergies:   Aspirin; Saccharin;  and Soap   Social History   Socioeconomic History  . Marital status: Divorced    Spouse name: Not on file  . Number of children: Not on file  . Years of education: Not on file  . Highest education level: Not on file  Social Needs  . Financial resource strain: Not on file  . Food insecurity - worry: Not on file  . Food insecurity - inability: Not on file  . Transportation needs - medical: Not on file  . Transportation needs - non-medical: Not on file  Occupational History  . Not on file  Tobacco Use  . Smoking status: Former Smoker    Types: Cigarettes    Last attempt to quit: 05/13/1987    Years since quitting: 30.1  . Smokeless tobacco: Never Used  Substance and Sexual Activity  . Alcohol use: Yes    Alcohol/week: 0.0 oz    Comment: very rare (wine at christmas)  . Drug use: No  . Sexual activity: Not on file  Other Topics Concern  . Not on file  Social History Narrative  . Not on file     Family History:  The patient's ***family history includes Diabetes Mellitus II in her sister; Heart Problems in her brother; Heart attack in her father; Leukemia in her mother; Other in her brother.   Review of Systems:   Please see the history of present illness.     General:  No chills, fever, night sweats or weight changes.  Cardiovascular:  No chest pain, dyspnea on exertion, edema, orthopnea, palpitations, paroxysmal nocturnal dyspnea. Dermatological: No rash, lesions/masses Respiratory: No cough, dyspnea Urologic: No hematuria, dysuria Abdominal:   No nausea, vomiting, diarrhea, bright red blood per rectum, melena, or hematemesis Neurologic:  No visual changes, wkns, changes in mental status. All other systems  reviewed and are otherwise negative except as noted above.   Physical Exam:    VS:  There were no vitals taken for this visit.   General: Well developed, well nourished,female appearing in no acute distress. Head: Normocephalic, atraumatic, sclera non-icteric, no xanthomas, nares are without discharge.  Neck: No carotid bruits. JVD not elevated.  Lungs: Respirations regular and unlabored, without wheezes or rales.  Heart: ***Regular rate and rhythm. No S3 or S4.  No murmur, no rubs, or gallops appreciated. Abdomen: Soft, non-tender, non-distended with normoactive bowel sounds. No hepatomegaly. No rebound/guarding. No obvious abdominal masses. Msk:  Strength and tone appear normal for age. No joint deformities or effusions. Extremities: No clubbing or cyanosis. No edema.  Distal pedal pulses are 2+ bilaterally. Neuro: Alert and oriented X 3. Moves all extremities spontaneously. No focal deficits noted. Psych:  Responds to questions appropriately with a normal affect. Skin: No rashes or lesions noted  Wt Readings from Last 3 Encounters:  05/24/17 181 lb 9.6 oz (82.4 kg)  04/29/17 153 lb (69.4 kg)  03/02/17 164 lb (74.4 kg)        Studies/Labs Reviewed:   EKG:  EKG is*** ordered today.  The ekg ordered today demonstrates ***  Recent Labs: 03/02/2017: BUN 32; Creatinine, Ser 1.67; Potassium 4.4; Sodium 142   Lipid Panel    Component Value Date/Time   CHOL 111 04/01/2015 0428   TRIG 84 04/01/2015 0428   HDL 37 (L) 04/01/2015 0428   CHOLHDL 3.0 04/01/2015 0428   VLDL 17 04/01/2015 0428   LDLCALC 57 04/01/2015 0428    Additional studies/ records that were reviewed today include:   Echocardiogram: 06/07/2017 Study Conclusions  -  Left ventricle: The cavity size was normal. Wall thickness was   normal. Systolic function was normal. The estimated ejection   fraction was in the range of 50% to 55%. Wall motion was normal;   there were no regional wall motion abnormalities. -  Mitral valve: Calcified annulus. There was mild regurgitation. - Left atrium: The atrium was moderately dilated. - Right atrium: The atrium was mildly dilated. - Pulmonary arteries: Systolic pressure was mildly increased. PA   peak pressure: 37 mm Hg (S). - Pericardium, extracardiac: A trivial pericardial effusion was   identified.  Impressions:  - Normal LV systolic function; mild MR; biatrial enlargement; mild   TR with mildly elevated pulmonary pressure.   Assessment:    No diagnosis found.   Plan:   In order of problems listed above:  1. ***    Medication Adjustments/Labs and Tests Ordered: Current medicines are reviewed at length with the patient today.  Concerns regarding medicines are outlined above.  Medication changes, Labs and Tests ordered today are listed in the Patient Instructions below. There are no Patient Instructions on file for this visit.   Signed, Ellsworth LennoxBrittany M Kewon Statler, PA-C  07/05/2017 3:47 PM    [facility] [address], [address] [place], [place]  [address] Phone: [phone]; Fax: [phone]  [address], [address] [place], [place] [address] Phone: [phone]

## 2017-07-06 ENCOUNTER — Ambulatory Visit: Payer: Medicare Other | Admitting: Student

## 2017-08-09 NOTE — Progress Notes (Signed)
HPI: Follow-up MAT/atrial flutter. Carotid Dopplers September 2016 showed 1-39% bilateral stenosis. Monitor in September 2016 showed sinus rhythm with PACs, brief PAT and PVCs. Nuclear study October 2016 showed ejection fraction 59% and normal perfusion. Also with chronic diastolic congestive heart failure. Echo repeated 11/18 and showed normal LV function; mild MR; biatrial enlargement. Metoprolol increased at last ov for improved rate control. Since last seen,  she does have pedal edema which she states is chronic.  She denies increased dyspnea, orthopnea or PND.  No chest pain, palpitations or syncope.  Some fatigue in the last 3-4 weeks.  Current Outpatient Medications  Medication Sig Dispense Refill  . acarbose (PRECOSE) 25 MG tablet Take 25 mg by mouth 3 (three) times daily with meals. (0900, 1300, &1700)    . apixaban (ELIQUIS) 2.5 MG TABS tablet Resume this drug tomorrow afternoon.  Take it just like you did before surgery. 60 tablet 2  . brimonidine (ALPHAGAN) 0.2 % ophthalmic solution Place 1 drop into both eyes 2 (two) times daily. (0900 & 2100)    . calcium-vitamin D (OSCAL WITH D) 500-200 MG-UNIT per tablet Take 1 tablet by mouth 2 (two) times daily. (0900 & 2100)    . Carboxymethylcellul-Glycerin (REFRESH OPTIVE OP) Place 1 drop into both eyes 2 (two) times daily. (0900 & 2100)    . ezetimibe-simvastatin (VYTORIN) 10-10 MG per tablet Take 1 tablet by mouth at bedtime. (2100)    . ferrous sulfate 325 (65 FE) MG tablet Take 325 mg by mouth every other day. (0900)    . hydrALAZINE (APRESOLINE) 50 MG tablet Take 50 mg by mouth 3 (three) times daily. (0900, 1300, & 2100)    . insulin detemir (LEVEMIR) 100 UNIT/ML injection Inject 12-44 Units into the skin 2 (two) times daily. 44 UNITS IN THE MORNING & 12 UNITS AT BEDTIME.    . insulin lispro (HUMALOG) 100 UNIT/ML injection Inject 2-10 Units into the skin 3 (three) times daily before meals. (0630, 1130, & 1630) 150-200=2 UNITS,  201-250=4 UNITS, 251-300=6 UNITS, 301-350=8 UNITS, 351-400=10 UNITS, & >400 CALL NP    . magnesium oxide (MAG-OX) 400 (241.3 MG) MG tablet Take 1 tablet (400 mg total) by mouth 2 (two) times daily. (Patient taking differently: Take 400 mg by mouth 2 (two) times daily. (0900 & 2100)) 60 tablet 0  . Melatonin 3 MG TABS Take 6 mg by mouth at bedtime. (2100)    . metoprolol tartrate (LOPRESSOR) 50 MG tablet Take 1 tablet (50 mg total) by mouth 2 (two) times daily. Note dose increased    . mirabegron ER (MYRBETRIQ) 50 MG TB24 tablet Take 50 mg by mouth daily. (0900)    . oxyCODONE (OXY IR/ROXICODONE) 5 MG immediate release tablet Take 0.5-1 tablets (2.5-5 mg total) by mouth every 6 (six) hours as needed for moderate pain, severe pain or breakthrough pain. 10 tablet 0  . pantoprazole (PROTONIX) 40 MG tablet Take 40 mg by mouth daily.    Marland Kitchen. POLYETHYLENE GLYCOL 3350 PO Take 17 g by mouth daily. (0900)    . potassium chloride SA (K-DUR,KLOR-CON) 20 MEQ tablet Take 20 mEq by mouth daily. (0900)    . prednisoLONE acetate (PRED FORTE) 1 % ophthalmic suspension Place 1 drop into the right eye 2 (two) times daily. (0900 & 2100)    . predniSONE (DELTASONE) 20 MG tablet Take 40 mg by mouth daily with breakfast. (0900)    . sertraline (ZOLOFT) 50 MG tablet Take 50 mg by mouth  daily. (0900)    . tamsulosin (FLOMAX) 0.4 MG CAPS capsule Take 0.4 mg by mouth daily. (0900)    . torsemide (DEMADEX) 20 MG tablet Take 40 mg by mouth daily. (0900)    . traMADol (ULTRAM) 50 MG tablet Take 50 mg by mouth every 6 (six) hours as needed (FOR PAIN.).     Marland Kitchen vitamin B-12 (CYANOCOBALAMIN) 1000 MCG tablet Take 1,000 mcg by mouth every other day. (0900)    . vitamin C (ASCORBIC ACID) 500 MG tablet Take 500 mg by mouth daily. (0900)    . Vitamin D, Ergocalciferol, (DRISDOL) 50000 units CAPS capsule Take 50,000 Units by mouth every 30 (thirty) days. (0900)    . zaleplon (SONATA) 5 MG capsule Take 5 mg by mouth at bedtime. (2100)     No  current facility-administered medications for this visit.      Past Medical History:  Diagnosis Date  . Acute bronchitis    hx of   . Age-related physical debility   . Allergic rhinitis   . Anemia    iron deficiency per H7P [facility]  . Asthma    per H&P from [facility]  . Cataract   . CHF (congestive heart failure) (HCC)    acute systolic (congestive)heart failure per H&P from [facility]  . Complication of anesthesia    per sister, states daughter said she is hard to wake up  . Constipation   . COPD (chronic obstructive pulmonary disease) (HCC)    per H&P from [facility]  . Dementia    from progress note from [facility]  . Depression    per H&P from [facility]  . DM2 (diabetes mellitus, type 2) (HCC)   . Dry beriberi   . Dry eye syndrome   . Dyspnea    per H&P from [facility]  . Dysrhythmia    h/o SVT  and atrial flutter from H&P from [facility]  . GERD (gastroesophageal reflux disease)    per H&P from [facility]  . Glaucoma   . Headache    from progress note from [facility]  . HTN (hypertension)    per H&P from [facility]  . Hyperlipidemia   . Hypokalemia    hx of   . Insomnia   . Multifocal atrial tachycardia (HCC)   . Osteoporosis   . Overactive bladder   . Vitamin B deficiency     Past Surgical History:  Procedure Laterality Date  . ANKLE SURGERY Left   . ARTERY BIOPSY Right 04/29/2017   Procedure: RIGHT TEMPORAL ARTERY BIOPSY;  Surgeon: [name], MD;  Location: [facility];  Service: General;  Laterality: Right;  . DILATION AND CURETTAGE OF UTERUS    . DILATION AND CURETTAGE, DIAGNOSTIC / THERAPEUTIC    . EYE SURGERY  2011, 2012  . FOOT SURGERY Left   . HAND SURGERY Left   . KNEE SURGERY Left 2011    Social History   Socioeconomic History  . Marital status: Divorced    Spouse name: Not on file  . Number of children: Not on file    . Years of education: Not on file  . Highest education level: Not on file  Social Needs  . Financial resource strain: Not on file  . Food insecurity - worry: Not on file  . Food insecurity - inability: Not on file  . Transportation needs - medical: Not on  file  . Transportation needs - non-medical: Not on file  Occupational History  . Not on file  Tobacco Use  . Smoking status: Former Smoker    Types: Cigarettes    Last attempt to quit: 05/13/1987    Years since quitting: 30.2  . Smokeless tobacco: Never Used  Substance and Sexual Activity  . Alcohol use: Yes    Alcohol/week: 0.0 oz    Comment: very rare (wine at christmas)  . Drug use: No  . Sexual activity: Not on file  Other Topics Concern  . Not on file  Social History Narrative  . Not on file    Family History  Problem Relation Age of Onset  . Leukemia Mother   . Heart attack Father   . Heart Problems Brother   . Other Brother        kidney problems  . Diabetes Mellitus II Sister     ROS: no fevers or chills, productive cough, hemoptysis, dysphasia, odynophagia, melena, hematochezia, dysuria, hematuria, rash, seizure activity, orthopnea, PND, claudication. Remaining systems are negative.  Physical Exam: Well-developed obese in no acute distress.  Skin is warm and dry.  HEENT is normal.  Neck is supple.  Chest is clear to auscultation with normal expansion.  Cardiovascular exam is irregular Abdominal exam nontender or distended. No masses palpated. Extremities show 2+ edema. neuro grossly intact  ECG-atypical atrial flutter, RV conduction delay, low voltage.  Personally reviewed  A/P  1 atrial flutter-patient remains in atrial flutter.  Continue metoprolol for rate control.  Continue apixaban.  It is not clear to me whether atrial arrhythmias are contributing to her increased lower extremity edema as she states this is long-term.  We discussed rate control versus rhythm control.  It may be worthwhile to  try cardioversion to see if sinus would improve her edema.  However she is hesitant.  Her daughter states they will discuss and then make a decision.  For now we will continue with rate control.  Check 24-hour Holter monitor.  2 chronic diastolic congestive heart failure-patient appears to be doing well from a volume standpoint.  Continue present dose of diuretic. Check BMET.  3 hypertension-blood pressure is controlled.  Continue present medications.    4 chronic stage III kidney disease-check bmet.  Olga Millers, MD

## 2017-08-16 ENCOUNTER — Encounter: Payer: Self-pay | Admitting: Cardiology

## 2017-08-16 ENCOUNTER — Ambulatory Visit (INDEPENDENT_AMBULATORY_CARE_PROVIDER_SITE_OTHER): Payer: Medicare Other | Admitting: Cardiology

## 2017-08-16 VITALS — BP 120/68 | HR 91 | Ht 60.0 in | Wt 178.4 lb

## 2017-08-16 DIAGNOSIS — I10 Essential (primary) hypertension: Secondary | ICD-10-CM | POA: Diagnosis not present

## 2017-08-16 DIAGNOSIS — I5032 Chronic diastolic (congestive) heart failure: Secondary | ICD-10-CM | POA: Diagnosis not present

## 2017-08-16 DIAGNOSIS — I484 Atypical atrial flutter: Secondary | ICD-10-CM

## 2017-08-16 NOTE — Patient Instructions (Signed)
Medication Instructions:   NO CHANGE  Labwork:  Your physician recommends that you HAVE LAB WORK TODAY  Testing/Procedures:  Your physician has recommended that you wear a 24 HOUR holter monitor. Holter monitors are medical devices that record the heart's electrical activity. Doctors most often use these monitors to diagnose arrhythmias. Arrhythmias are problems with the speed or rhythm of the heartbeat. The monitor is a small, portable device. You can wear one while you do your normal daily activities. This is usually used to diagnose what is causing palpitations/syncope (passing out).    Follow-Up:  Your physician recommends that you schedule a follow-up appointment in: 4 MONTHS WITH DR Jens SomRENSHAW   If you need a refill on your cardiac medications before your next appointment, please call your pharmacy.

## 2017-08-17 LAB — BASIC METABOLIC PANEL
BUN/Creatinine Ratio: 22 (ref 12–28)
BUN: 31 mg/dL — AB (ref 8–27)
CO2: 25 mmol/L (ref 20–29)
CREATININE: 1.41 mg/dL — AB (ref 0.57–1.00)
Calcium: 9.4 mg/dL (ref 8.7–10.3)
Chloride: 96 mmol/L (ref 96–106)
GFR calc Af Amer: 39 mL/min/{1.73_m2} — ABNORMAL LOW (ref >59)
GFR, EST NON AFRICAN AMERICAN: 34 mL/min/{1.73_m2} — AB (ref >59)
Glucose: 261 mg/dL — ABNORMAL HIGH (ref 65–99)
Potassium: 4.1 mmol/L (ref 3.5–5.2)
SODIUM: 141 mmol/L (ref 134–144)

## 2017-08-23 ENCOUNTER — Ambulatory Visit (INDEPENDENT_AMBULATORY_CARE_PROVIDER_SITE_OTHER): Payer: Medicare Other

## 2017-08-23 DIAGNOSIS — I484 Atypical atrial flutter: Secondary | ICD-10-CM | POA: Diagnosis not present

## 2017-12-07 NOTE — Progress Notes (Deleted)
HPI: Follow-upMAT/atrial flutter.Carotid Dopplers September 2016 showed 1-39% bilateral stenosis. Nuclear study October 2016 showed ejection fraction 59% and normal perfusion. Also with chronic diastolic congestive heart failure. Echo repeated 11/18 and showed normal LV function; mild MR; biatrial enlargement.  We discussed cardioversion at last office visit but patient hesitant.  Holter monitor January 2019 showed atrial flutter rate controlled.  Since last seen,   Current Outpatient Medications  Medication Sig Dispense Refill  . acarbose (PRECOSE) 25 MG tablet Take 25 mg by mouth 3 (three) times daily with meals. (0900, 1300, &1700)    . apixaban (ELIQUIS) 2.5 MG TABS tablet Resume this drug tomorrow afternoon.  Take it just like you did before surgery. 60 tablet 2  . brimonidine (ALPHAGAN) 0.2 % ophthalmic solution Place 1 drop into both eyes 2 (two) times daily. (0900 & 2100)    . calcium-vitamin D (OSCAL WITH D) 500-200 MG-UNIT per tablet Take 1 tablet by mouth 2 (two) times daily. (0900 & 2100)    . Carboxymethylcellul-Glycerin (REFRESH OPTIVE OP) Place 1 drop into both eyes 2 (two) times daily. (0900 & 2100)    . ezetimibe-simvastatin (VYTORIN) 10-10 MG per tablet Take 1 tablet by mouth at bedtime. (2100)    . ferrous sulfate 325 (65 FE) MG tablet Take 325 mg by mouth every other day. (0900)    . hydrALAZINE (APRESOLINE) 50 MG tablet Take 50 mg by mouth 3 (three) times daily. (0900, 1300, & 2100)    . insulin detemir (LEVEMIR) 100 UNIT/ML injection Inject 12-44 Units into the skin 2 (two) times daily. 44 UNITS IN THE MORNING & 12 UNITS AT BEDTIME.    . insulin lispro (HUMALOG) 100 UNIT/ML injection Inject 2-10 Units into the skin 3 (three) times daily before meals. (0630, 1130, & 1630) 150-200=2 UNITS, 201-250=4 UNITS, 251-300=6 UNITS, 301-350=8 UNITS, 351-400=10 UNITS, & >400 CALL NP    . magnesium oxide (MAG-OX) 400 (241.3 MG) MG tablet Take 1 tablet (400 mg total) by mouth 2  (two) times daily. (Patient taking differently: Take 400 mg by mouth 2 (two) times daily. (0900 & 2100)) 60 tablet 0  . Melatonin 3 MG TABS Take 6 mg by mouth at bedtime. (2100)    . metoprolol tartrate (LOPRESSOR) 50 MG tablet Take 1 tablet (50 mg total) by mouth 2 (two) times daily. Note dose increased    . mirabegron ER (MYRBETRIQ) 50 MG TB24 tablet Take 50 mg by mouth daily. (0900)    . oxyCODONE (OXY IR/ROXICODONE) 5 MG immediate release tablet Take 0.5-1 tablets (2.5-5 mg total) by mouth every 6 (six) hours as needed for moderate pain, severe pain or breakthrough pain. 10 tablet 0  . pantoprazole (PROTONIX) 40 MG tablet Take 40 mg by mouth daily.    Marland Kitchen POLYETHYLENE GLYCOL 3350 PO Take 17 g by mouth daily. (0900)    . potassium chloride SA (K-DUR,KLOR-CON) 20 MEQ tablet Take 20 mEq by mouth daily. (0900)    . prednisoLONE acetate (PRED FORTE) 1 % ophthalmic suspension Place 1 drop into the right eye 2 (two) times daily. (0900 & 2100)    . predniSONE (DELTASONE) 20 MG tablet Take 40 mg by mouth daily with breakfast. (0900)    . sertraline (ZOLOFT) 50 MG tablet Take 50 mg by mouth daily. (0900)    . tamsulosin (FLOMAX) 0.4 MG CAPS capsule Take 0.4 mg by mouth daily. (0900)    . torsemide (DEMADEX) 20 MG tablet Take 40 mg by mouth daily. (0900)    .  traMADol (ULTRAM) 50 MG tablet Take 50 mg by mouth every 6 (six) hours as needed (FOR PAIN.).     Marland Kitchen vitamin B-12 (CYANOCOBALAMIN) 1000 MCG tablet Take 1,000 mcg by mouth every other day. (0900)    . vitamin C (ASCORBIC ACID) 500 MG tablet Take 500 mg by mouth daily. (0900)    . Vitamin D, Ergocalciferol, (DRISDOL) 50000 units CAPS capsule Take 50,000 Units by mouth every 30 (thirty) days. (0900)    . zaleplon (SONATA) 5 MG capsule Take 5 mg by mouth at bedtime. (2100)     No current facility-administered medications for this visit.      Past Medical History:  Diagnosis Date  . Acute bronchitis    hx of   . Age-related physical debility   .  Allergic rhinitis   . Anemia    iron deficiency per H7P from [facility]  . Asthma    per H&P from [facility]  . Cataract   . CHF (congestive heart failure) (HCC)    acute systolic (congestive)heart failure per H&P from [facility]  . Complication of anesthesia    per sister, states daughter said she is hard to wake up  . Constipation   . COPD (chronic obstructive pulmonary disease) (HCC)    per H&P from [facility]  . Dementia    from progress note from [facility]  . Depression    per H&P from [facility]  . DM2 (diabetes mellitus, type 2) (HCC)   . Dry beriberi   . Dry eye syndrome   . Dyspnea    per H&P from [facility]  . Dysrhythmia    h/o SVT  and atrial flutter from H&P from [facility]  . GERD (gastroesophageal reflux disease)    per H&P from [facility]  . Glaucoma   . Headache    from progress note from [facility]  . HTN (hypertension)    per H&P from [facility]  . Hyperlipidemia   . Hypokalemia    hx of   . Insomnia   . Multifocal atrial tachycardia (HCC)   . Osteoporosis   . Overactive bladder   . Vitamin B deficiency     Past Surgical History:  Procedure Laterality Date  . ANKLE SURGERY Left   . ARTERY BIOPSY Right 04/29/2017   Procedure: RIGHT TEMPORAL ARTERY BIOPSY;  Surgeon: [name], MD;  Location: [facility];  Service: General;  Laterality: Right;  . DILATION AND CURETTAGE OF UTERUS    . DILATION AND CURETTAGE, DIAGNOSTIC / THERAPEUTIC    . EYE SURGERY  2011, 2012  . FOOT SURGERY Left   . HAND SURGERY Left   . KNEE SURGERY Left 2011    Social History   Socioeconomic History  . Marital status: Divorced    Spouse name: Not on file  . Number of children: Not on file  . Years of education: Not on file  . Highest education level: Not on file  Occupational History  . Not on file  Social Needs  . Financial resource strain: Not on file  .  Food insecurity:    Worry: Not on file    Inability: Not on file  . Transportation needs:    Medical: Not on file    Non-medical: Not on file  Tobacco Use  . Smoking status: Former Smoker    Types: Cigarettes    Last attempt to quit: 05/13/1987  Years since quitting: 30.5  . Smokeless tobacco: Never Used  Substance and Sexual Activity  . Alcohol use: Yes    Alcohol/week: 0.0 oz    Comment: very rare (wine at christmas)  . Drug use: No    Frequency: 5.0 times per week  . Sexual activity: Not on file  Lifestyle  . Physical activity:    Days per week: Not on file    Minutes per session: Not on file  . Stress: Not on file  Relationships  . Social connections:    Talks on phone: Not on file    Gets together: Not on file    Attends religious service: Not on file    Active member of club or organization: Not on file    Attends meetings of clubs or organizations: Not on file    Relationship status: Not on file  . Intimate partner violence:    Fear of current or ex partner: Not on file    Emotionally abused: Not on file    Physically abused: Not on file    Forced sexual activity: Not on file  Other Topics Concern  . Not on file  Social History Narrative  . Not on file    Family History  Problem Relation Age of Onset  . Leukemia Mother   . Heart attack Father   . Heart Problems Brother   . Other Brother        kidney problems  . Diabetes Mellitus II Sister     ROS: no fevers or chills, productive cough, hemoptysis, dysphasia, odynophagia, melena, hematochezia, dysuria, hematuria, rash, seizure activity, orthopnea, PND, pedal edema, claudication. Remaining systems are negative.  Physical Exam: Well-developed well-nourished in no acute distress.  Skin is warm and dry.  HEENT is normal.  Neck is supple.  Chest is clear to auscultation with normal expansion.  Cardiovascular exam is regular rate and rhythm.  Abdominal exam nontender or distended. No masses  palpated. Extremities show no edema. neuro grossly intact  ECG- personally reviewed  A/P  1  Olga Millers, MD

## 2017-12-16 ENCOUNTER — Ambulatory Visit: Payer: Medicare Other | Admitting: Cardiology

## 2018-01-30 ENCOUNTER — Telehealth: Payer: Self-pay | Admitting: Cardiology

## 2018-01-30 NOTE — Telephone Encounter (Signed)
New Message    Pt c/o medication issue:  1. Name of Medication: furosemide  2. How are you currently taking this medication (dosage and times per day)? daily  3. Are you having a reaction (difficulty breathing--STAT)? No  4. What is your medication issue? Patient sister would like to talk to the nurse about patient taking furosemide, she has many concerns.

## 2018-01-30 NOTE — Progress Notes (Signed)
Cardiology Office Note   Date:  02/01/2018   ID:  Jocelyn, Schultz [birth year], MRN [id number]  PCP:  Marden Noble, MD  Cardiologist:  Winfield Rast  No chief complaint on file.    History of Present Illness: Jocelyn Schultz is a 84 y.o. female who presents for ongoing assessment and management of MAT/atrial flutter. She had a cardiac monitor in 2016 revealing NSR with PAC;s, echo in 2018 demonstrated normal LV function with mild MR, biatrial enlargement. She was last seen by Dr.Crenshaw on 08/16/2017 continued on metoprolol, apixaban, and a 24 hour Holter monitor was planned.   She has had pneumonia and has had slow recovery from this She is currently in [facility] and is followed by [facility] nurse. She has significant deconditioning and prefers to stay in her bed. She had been walking and participating in activities but since moving from new facility, she has become depressed and more deconditioned. She denies chest pain, she has mild dyspnea but does not ambulate consistently.   She has had recent labs dated 01/13/2018: Hgb 9.6; Hct 29.3.  Na+ 142; potassium 3.6, Cl 99; CO2 31, Glucose 59.   Past Medical History:  Diagnosis Date  . Acute bronchitis    hx of   . Age-related physical debility   . Allergic rhinitis   . Anemia    iron deficiency per H7P from [facility]  . Asthma    per H&P from [facility]  . Cataract   . CHF (congestive heart failure) (HCC)    acute systolic (congestive)heart failure per H&P from [facility]  . Complication of anesthesia    per sister, states daughter said she is hard to wake up  . Constipation   . COPD (chronic obstructive pulmonary disease) (HCC)    per H&P from [facility]  . Dementia    from progress note from [facility]  . Depression    per H&P from [facility]  . DM2 (diabetes mellitus, type 2) (HCC)   . Dry beriberi   . Dry eye syndrome   . Dyspnea    per H&P from [facility]    . Dysrhythmia    h/o SVT  and atrial flutter from H&P from [facility]  . GERD (gastroesophageal reflux disease)    per H&P from [facility]  . Glaucoma   . Headache    from progress note from [facility]  . HTN (hypertension)    per H&P from [facility]  . Hyperlipidemia   . Hypokalemia    hx of   . Insomnia   . Multifocal atrial tachycardia (HCC)   . Osteoporosis   . Overactive bladder   . Vitamin B deficiency     Past Surgical History:  Procedure Laterality Date  . ANKLE SURGERY Left   . ARTERY BIOPSY Right 04/29/2017   Procedure: RIGHT TEMPORAL ARTERY BIOPSY;  Surgeon: [name], MD;  Location: [facility];  Service: General;  Laterality: Right;  . DILATION AND CURETTAGE OF UTERUS    . DILATION AND CURETTAGE, DIAGNOSTIC / THERAPEUTIC    . EYE SURGERY  2011, 2012  . FOOT SURGERY Left   . HAND SURGERY Left   . KNEE SURGERY Left 2011     Current Outpatient Medications  Medication Sig Dispense Refill  . acarbose (PRECOSE) 25 MG tablet Take 25 mg by mouth 3 (three) times daily with meals. (0900, 1300, &1700)    .  apixaban (ELIQUIS) 2.5 MG TABS tablet Resume this drug tomorrow afternoon.  Take it just like you did before surgery. 60 tablet 2  . brimonidine (ALPHAGAN) 0.2 % ophthalmic solution Place 1 drop into both eyes 2 (two) times daily. (0900 & 2100)    . calcium-vitamin D (OSCAL WITH D) 500-200 MG-UNIT per tablet Take 1 tablet by mouth 2 (two) times daily. (0900 & 2100)    . Carboxymethylcellul-Glycerin (REFRESH OPTIVE OP) Place 1 drop into both eyes 2 (two) times daily. (0900 & 2100)    . ezetimibe-simvastatin (VYTORIN) 10-10 MG per tablet Take 1 tablet by mouth at bedtime. (2100)    . ferrous sulfate 325 (65 FE) MG tablet Take 325 mg by mouth every other day. (0900)    . hydrALAZINE (APRESOLINE) 50 MG tablet Take 50 mg by mouth 3 (three) times daily. (0900, 1300, & 2100)    . insulin detemir (LEVEMIR) 100 UNIT/ML injection Inject  12-44 Units into the skin 2 (two) times daily. 44 UNITS IN THE MORNING & 12 UNITS AT BEDTIME.    . insulin lispro (HUMALOG) 100 UNIT/ML injection Inject 2-10 Units into the skin 3 (three) times daily before meals. (0630, 1130, & 1630) 150-200=2 UNITS, 201-250=4 UNITS, 251-300=6 UNITS, 301-350=8 UNITS, 351-400=10 UNITS, & >400 CALL NP    . magnesium oxide (MAG-OX) 400 (241.3 MG) MG tablet Take 1 tablet (400 mg total) by mouth 2 (two) times daily. (Patient taking differently: Take 400 mg by mouth 2 (two) times daily. (0900 & 2100)) 60 tablet 0  . Melatonin 3 MG TABS Take 6 mg by mouth at bedtime. (2100)    . metoprolol tartrate (LOPRESSOR) 50 MG tablet Take 1 tablet (50 mg total) by mouth 2 (two) times daily. Note dose increased    . mirabegron ER (MYRBETRIQ) 50 MG TB24 tablet Take 50 mg by mouth daily. (0900)    . oxyCODONE (OXY IR/ROXICODONE) 5 MG immediate release tablet Take 0.5-1 tablets (2.5-5 mg total) by mouth every 6 (six) hours as needed for moderate pain, severe pain or breakthrough pain. 10 tablet 0  . pantoprazole (PROTONIX) 40 MG tablet Take 40 mg by mouth daily.    Marland Kitchen POLYETHYLENE GLYCOL 3350 PO Take 17 g by mouth daily. (0900)    . potassium chloride SA (K-DUR,KLOR-CON) 20 MEQ tablet Take 20 mEq by mouth daily. (0900)    . prednisoLONE acetate (PRED FORTE) 1 % ophthalmic suspension Place 1 drop into the right eye 2 (two) times daily. (0900 & 2100)    . predniSONE (DELTASONE) 20 MG tablet Take 40 mg by mouth daily with breakfast. (0900)    . sertraline (ZOLOFT) 50 MG tablet Take 50 mg by mouth daily. (0900)    . tamsulosin (FLOMAX) 0.4 MG CAPS capsule Take 0.4 mg by mouth daily. (0900)    . torsemide (DEMADEX) 20 MG tablet Take 40 mg by mouth daily. (0900)    . traMADol (ULTRAM) 50 MG tablet Take 50 mg by mouth every 6 (six) hours as needed (FOR PAIN.).     Marland Kitchen vitamin B-12 (CYANOCOBALAMIN) 1000 MCG tablet Take 1,000 mcg by mouth every other day. (0900)    . vitamin C (ASCORBIC ACID) 500  MG tablet Take 500 mg by mouth daily. (0900)    . Vitamin D, Ergocalciferol, (DRISDOL) 50000 units CAPS capsule Take 50,000 Units by mouth every 30 (thirty) days. (0900)    . zaleplon (SONATA) 5 MG capsule Take 5 mg by mouth at bedtime. (2100)  No current facility-administered medications for this visit.     Allergies:   Aspirin; Saccharin; and Soap    Social History:  The patient  reports that she quit smoking about 30 years ago. Her smoking use included cigarettes. She has never used smokeless tobacco. She reports that she drinks alcohol. She reports that she does not use drugs.   Family History:  The patient's family history includes Diabetes Mellitus II in her sister; Heart Problems in her brother; Heart attack in her father; Leukemia in her mother; Other in her brother.    ROS: All other systems are reviewed and negative. Unless otherwise mentioned in H&P    PHYSICAL EXAM: VS:  BP (!) 168/54   Pulse 71   Ht 5\' 1"  (1.549 m)   Wt 181 lb 6.4 oz (82.3 kg)   BMI 34.28 kg/m  , BMI Body mass index is 34.28 kg/m. GEN: Well nourished, well developed, in no acute distress  HEENT: normal  Neck: no JVD, carotid bruits, or masses Cardiac:IRRR; no murmurs, rubs, or gallops,no edema  Respiratory:  Bilateral crackles are noted, no wheezes. GI: soft, nontender, nondistended, + BS MS: no deformity or atrophy- 2+ bilateral pitting edema  Skin: warm and dry, no rash, pale  Neuro:  Strength and sensation are intact Psych: Flat affect.           EKG:  Sinus rhythm with sinus arrhythmia, PVC's Incomplete RBBB, non-specific T-wave abnormalities.   Recent Labs: 08/16/2017: BUN 31; Creatinine, Ser 1.41; Potassium 4.1; Sodium 141    Lipid Panel    Component Value Date/Time   CHOL 111 04/01/2015 0428   TRIG 84 04/01/2015 0428   HDL 37 (L) 04/01/2015 0428   CHOLHDL 3.0 04/01/2015 0428   VLDL 17 04/01/2015 0428   LDLCALC 57 04/01/2015 0428      Wt Readings from Last 3 Encounters:    02/01/18 181 lb 6.4 oz (82.3 kg)  08/16/17 178 lb 6.4 oz (80.9 kg)  05/24/17 181 lb 9.6 oz (82.4 kg)      Other studies Reviewed: Echocardiogram 06/18/17 - Left ventricle: The cavity size was normal. Wall thickness was   normal. Systolic function was normal. The estimated ejection   fraction was in the range of 50% to 55%. Wall motion was normal;   there were no regional wall motion abnormalities. - Mitral valve: Calcified annulus. There was mild regurgitation. - Left atrium: The atrium was moderately dilated. - Right atrium: The atrium was mildly dilated. - Pulmonary arteries: Systolic pressure was mildly increased. PA   peak pressure: 37 mm Hg (S). - Pericardium, extracardiac: A trivial pericardial effusion was   identified.  Impressions:  - Normal LV systolic function; mild MR; biatrial enlargement; mild   TR with mildly elevated pulmonary pressure.  ASSESSMENT AND PLAN:  1. PAF: Today in sinus rhythm with multiple PVC's. Review of last labs demonstrated mild hypokalemia. I am repeating labs. She will continue apixaban 2.5 mg BID as directed for now. I am repeating CBC.   2. Chronic Diastolic CHF: She has evidence of volume overload despite increased dose of torsemide. This may be related in some part to anemia, vs ingestion of salty foods and diet drinks. I have asked her to wear support hose. She states she has them but no-one at the facility will put them on. I have written a note to have these placed daily and to remove in pm.       3. COPD: Followed by PCP  4.  Deconditioning: I have asked her to walk to the dining room for each meal and to participate in one weekly activity to improve socialization, and increase stamina.   5. Anemia: Rechecking CBC.   6. Hypertension: Not well controlled. I have rechecked in the exam room, decreased to 148/78. May need to increase the hydralazine if remains elevated. I am requesting the med list from [facility] to review before making  changes.   Current medicines are reviewed at length with the patient today.    Labs/ tests ordered today include: Requested CBC, BMET and BNP from facility.  Jocelyn Schultz, Jocelyn Schultz, Jocelyn Schultz   02/01/2018 11:20 AM    [facility] [address], [place], [place] [address] Phone: [phone]) [phone]; Fax: [phone]) [phone]

## 2018-01-30 NOTE — Telephone Encounter (Signed)
Spoke with eileen, the [employer] nurse ask her to call to get an appointment to be seen. She has swelling in her legs and some SOB. Follow up scheduled with extender.

## 2018-02-01 ENCOUNTER — Ambulatory Visit (INDEPENDENT_AMBULATORY_CARE_PROVIDER_SITE_OTHER): Payer: Medicare Other | Admitting: Adult Health

## 2018-02-01 ENCOUNTER — Encounter: Payer: Self-pay | Admitting: Adult Health

## 2018-02-01 VITALS — BP 168/54 | HR 71 | Ht 61.0 in | Wt 181.4 lb

## 2018-02-01 DIAGNOSIS — R5381 Other malaise: Secondary | ICD-10-CM

## 2018-02-01 DIAGNOSIS — I10 Essential (primary) hypertension: Secondary | ICD-10-CM | POA: Diagnosis not present

## 2018-02-01 DIAGNOSIS — D649 Anemia, unspecified: Secondary | ICD-10-CM | POA: Diagnosis not present

## 2018-02-01 DIAGNOSIS — J439 Emphysema, unspecified: Secondary | ICD-10-CM | POA: Diagnosis not present

## 2018-02-01 DIAGNOSIS — I5032 Chronic diastolic (congestive) heart failure: Secondary | ICD-10-CM | POA: Diagnosis not present

## 2018-02-01 DIAGNOSIS — I480 Paroxysmal atrial fibrillation: Secondary | ICD-10-CM | POA: Diagnosis not present

## 2018-02-01 NOTE — Patient Instructions (Addendum)
Medication Instructions:  NO CHANGES- Your physician recommends that you continue on your current medications as directed. Please refer to the Current Medication list given to you today.  If you need a refill on your cardiac medications before your next appointment, please call your pharmacy.  Labs: BMET, CBC AND BNP TODAY AT FACILITY  Special Instructions: Change to coke zero  Low salt diet  Increase walking  Follow-Up: Your physician wants you to follow-up in: 1 MONTH WITH  -OR- KATHRYN LAWRENCE (NURSE PRACTIONIER), DNP,AACC IF PRIMARY CARDIOLOGIST IS UNAVAILABLE.   Thank you for choosing [facility]!!

## 2018-03-06 NOTE — Progress Notes (Signed)
Cardiology Office Note   Date:  03/07/2018   ID:  Abbagail, Scaff [birth year], MRN [id number]  PCP:  Marden Noble, MD  Cardiologist: [facility]  Chief Complaint  Patient presents with  . Congestive Heart Failure  . Atrial Fibrillation     History of Present Illness: ANACRISTINA STEFFEK is a 84 y.o. female who presents for ongoing assessment and management of MAT/atrial flutter, She is currently in [facility] and is followed by [facility] nurse. She has significant deconditioning and prefers to stay in her bed. She had been walking and participating in activities but since moving from new facility, she has become depressed and more deconditioned. She denies chest pain, she has mild dyspnea but does not ambulate consistently.   On last office visit, 02/01/2018 I checked CBC. She had evidence of fluid overload. She was to continue diuretics and wear support hose. She was advised to increase her activity to promote stamina and avoid worsening deconditioning. Labs revealed elevated BNP of 1558, and mild anemia of Hgb of  9.4.    She comes today with a cough. Her torsemide was increased to 60 mg daily, and Eliquis was increased to 5 mg BID.  She has been started on Protonix 40 mg daily. She is very sedentary but walks once a day with SNF staff. She continues some depression.   Past Medical History:  Diagnosis Date  . Acute bronchitis    hx of   . Age-related physical debility   . Allergic rhinitis   . Anemia    iron deficiency per H7P from [facility]  . Asthma    per H&P from [facility]  . Cataract   . CHF (congestive heart failure) (HCC)    acute systolic (congestive)heart failure per H&P from [facility]  . Complication of anesthesia    per sister, states daughter said she is hard to wake up  . Constipation   . COPD (chronic obstructive pulmonary disease) (HCC)    per H&P from [facility]  . Dementia    from progress note from [facility]  .  Depression    per H&P from [facility]  . DM2 (diabetes mellitus, type 2) (HCC)   . Dry beriberi   . Dry eye syndrome   . Dyspnea    per H&P from [facility]  . Dysrhythmia    h/o SVT  and atrial flutter from H&P from [facility]  . GERD (gastroesophageal reflux disease)    per H&P from [facility]  . Glaucoma   . Headache    from progress note from [facility]  . HTN (hypertension)    per H&P from [facility]  . Hyperlipidemia   . Hypokalemia    hx of   . Insomnia   . Multifocal atrial tachycardia (HCC)   . Osteoporosis   . Overactive bladder   . Vitamin B deficiency     Past Surgical History:  Procedure Laterality Date  . ANKLE SURGERY Left   . ARTERY BIOPSY Right 04/29/2017   Procedure: RIGHT TEMPORAL ARTERY BIOPSY;  Surgeon: [name], MD;  Location: [facility];  Service: General;  Laterality: Right;  . DILATION AND CURETTAGE OF UTERUS    . DILATION AND CURETTAGE, DIAGNOSTIC / THERAPEUTIC    . EYE SURGERY  2011, 2012  . FOOT SURGERY Left   . HAND SURGERY Left   . KNEE SURGERY Left 2011  Current Outpatient Medications  Medication Sig Dispense Refill  . acarbose (PRECOSE) 25 MG tablet Take 25 mg by mouth 3 (three) times daily with meals. (0900, 1300, &1700)    . brimonidine (ALPHAGAN) 0.2 % ophthalmic solution Place 1 drop into both eyes 2 (two) times daily. (0900 & 2100)    . calcium-vitamin D (OSCAL WITH D) 500-200 MG-UNIT per tablet Take 1 tablet by mouth 2 (two) times daily. (0900 & 2100)    . Carboxymethylcellul-Glycerin (REFRESH OPTIVE OP) Place 1 drop into both eyes 2 (two) times daily. (0900 & 2100)    . ELIQUIS 5 MG TABS tablet Take 1 tablet by mouth every morning.    . ezetimibe-simvastatin (VYTORIN) 10-10 MG per tablet Take 1 tablet by mouth at bedtime. (2100)    . ferrous sulfate 325 (65 FE) MG tablet Take 325 mg by mouth every other day. (0900)    . hydrALAZINE (APRESOLINE) 50 MG tablet Take 50  mg by mouth 3 (three) times daily. (0900, 1300, & 2100)    . insulin detemir (LEVEMIR) 100 UNIT/ML injection Inject 12-44 Units into the skin 2 (two) times daily. 44 UNITS IN THE MORNING & 12 UNITS AT BEDTIME.    . insulin lispro (HUMALOG) 100 UNIT/ML injection Inject 2-10 Units into the skin 3 (three) times daily before meals. (0630, 1130, & 1630) 150-200=2 UNITS, 201-250=4 UNITS, 251-300=6 UNITS, 301-350=8 UNITS, 351-400=10 UNITS, & >400 CALL NP    . magnesium oxide (MAG-OX) 400 (241.3 MG) MG tablet Take 1 tablet (400 mg total) by mouth 2 (two) times daily. (Patient taking differently: Take 400 mg by mouth 2 (two) times daily. (0900 & 2100)) 60 tablet 0  . Melatonin 3 MG TABS Take 6 mg by mouth at bedtime. (2100)    . metoprolol tartrate (LOPRESSOR) 50 MG tablet Take 1 tablet (50 mg total) by mouth 2 (two) times daily. Note dose increased    . mirabegron ER (MYRBETRIQ) 50 MG TB24 tablet Take 50 mg by mouth daily. (0900)    . oxyCODONE (OXY IR/ROXICODONE) 5 MG immediate release tablet Take 0.5-1 tablets (2.5-5 mg total) by mouth every 6 (six) hours as needed for moderate pain, severe pain or breakthrough pain. 10 tablet 0  . pantoprazole (PROTONIX) 40 MG tablet Take 40 mg by mouth daily.    Marland Kitchen. POLYETHYLENE GLYCOL 3350 PO Take 17 g by mouth daily. (0900)    . potassium chloride SA (K-DUR,KLOR-CON) 20 MEQ tablet Take 20 mEq by mouth daily. (0900)    . prednisoLONE acetate (PRED FORTE) 1 % ophthalmic suspension Place 1 drop into the right eye 2 (two) times daily. (0900 & 2100)    . predniSONE (DELTASONE) 20 MG tablet Take 40 mg by mouth daily with breakfast. (0900)    . sertraline (ZOLOFT) 100 MG tablet Take 1 tablet by mouth every morning.    . tamsulosin (FLOMAX) 0.4 MG CAPS capsule Take 0.4 mg by mouth daily. (0900)    . torsemide (DEMADEX) 20 MG tablet Take 60 mg by mouth daily. (0900)    . traMADol (ULTRAM) 50 MG tablet Take 50 mg by mouth every 6 (six) hours as needed (FOR PAIN.).     Marland Kitchen. vitamin  B-12 (CYANOCOBALAMIN) 1000 MCG tablet Take 1,000 mcg by mouth every other day. (0900)    . vitamin C (ASCORBIC ACID) 500 MG tablet Take 500 mg by mouth daily. (0900)    . Vitamin D, Ergocalciferol, (DRISDOL) 50000 units CAPS capsule Take 50,000 Units by mouth every 30 (  thirty) days. (0900)    . zaleplon (SONATA) 5 MG capsule Take 5 mg by mouth at bedtime. (2100)     No current facility-administered medications for this visit.     Allergies:   Aspirin; Saccharin; and Soap    Social History:  The patient  reports that she quit smoking about 30 years ago. Her smoking use included cigarettes. She has never used smokeless tobacco. She reports that she drinks alcohol. She reports that she does not use drugs.   Family History:  The patient's family history includes Diabetes Mellitus II in her sister; Heart Problems in her brother; Heart attack in her father; Leukemia in her mother; Other in her brother.    ROS: All other systems are reviewed and negative. Unless otherwise mentioned in H&P    PHYSICAL EXAM: VS:  BP 115/62   Pulse 84   Ht 5\' 1"  (1.549 m)   Wt 182 lb 9.6 oz (82.8 kg)   SpO2 97%   BMI 34.50 kg/m  , BMI Body mass index is 34.5 kg/m. GEN: Well nourished, well developed, in no acute distress  HEENT: normal  Neck: no JVD, carotid bruits, or masses Cardiac: IRRR; no murmurs, rubs, or gallops,no edema  Respiratory:  Clear to auscultation bilaterally, normal work of breathing, upper airway coughing,  GI: soft, nontender, nondistended, + BS MS: no deformity or atrophy Wearing support hose,no significant edema, some dependent edema.  Skin: warm and dry, no rash Neuro:  Strength and sensation are intact Psych: euthymic mood, full affect   EKG:  Not completed this office visit.   Recent Labs: 08/16/2017: BUN 31; Creatinine, Ser 1.41; Potassium 4.1; Sodium 141    Lipid Panel    Component Value Date/Time   CHOL 111 04/01/2015 0428   TRIG 84 04/01/2015 0428   HDL 37 (L)  04/01/2015 0428   CHOLHDL 3.0 04/01/2015 0428   VLDL 17 04/01/2015 0428   LDLCALC 57 04/01/2015 0428      Wt Readings from Last 3 Encounters:  03/07/18 182 lb 9.6 oz (82.8 kg)  02/01/18 181 lb 6.4 oz (82.3 kg)  08/16/17 178 lb 6.4 oz (80.9 kg)      Other studies Reviewed: Echocardiogram Jul 07, 2017 - Left ventricle: The cavity size was normal. Wall thickness was normal. Systolic function was normal. The estimated ejection fraction was in the range of 50% to 55%. Wall motion was normal; there were no regional wall motion abnormalities. - Mitral valve: Calcified annulus. There was mild regurgitation. - Left atrium: The atrium was moderately dilated. - Right atrium: The atrium was mildly dilated. - Pulmonary arteries: Systolic pressure was mildly increased. PA peak pressure: 37 mm Hg (S). - Pericardium, extracardiac: A trivial pericardial effusion was identified.  Impressions:  - Normal LV systolic function; mild MR; biatrial enlargement; mild TR with mildly elevated pulmonary pressure.   ASSESSMENT AND PLAN:  1. Atrial fib: She has now been increased on Eliquis to 5 mg BID. She denies any bleeding or excessive bruising. Heart rate is well controlled on metoprolol 50 mg BID. She denies racing HR or palpitations.   2. Diastolic CHF: Some dependent edema, wearing support hose. Her torsemide has been increased to 60 ng daily. She is also on potassium supplements. Will check BMET and BNP today.  3. Cough: Will have CXR completed to evaluate for etiology of cough. Lungs do not sound particularly abnormal.   4. Hypercholesterolemia: Continues on Vytorin. Will need follow up labs on next office visit if not done  by PCP.    Current medicines are reviewed at length with the patient today.    Labs/ tests ordered today include: BMET, BNP, CXR  Bettey MareKathryn M. Liborio NixonLawrence DNP, ANP, AACC   03/07/2018 11:35 AM    [facility] [address],  [place], [place] [address] Phone: [phone](336) [phone]; Fax: [phone]

## 2018-03-07 ENCOUNTER — Ambulatory Visit (INDEPENDENT_AMBULATORY_CARE_PROVIDER_SITE_OTHER): Payer: Medicare Other | Admitting: Adult Health

## 2018-03-07 ENCOUNTER — Encounter: Payer: Self-pay | Admitting: Adult Health

## 2018-03-07 ENCOUNTER — Ambulatory Visit
Admission: RE | Admit: 2018-03-07 | Discharge: 2018-03-07 | Disposition: A | Discharge status: Home / Self Care | Payer: Medicare Other | Source: Ambulatory Visit | Attending: Adult Health | Admitting: Adult Health

## 2018-03-07 VITALS — BP 115/62 | HR 84 | Ht 61.0 in | Wt 182.6 lb

## 2018-03-07 DIAGNOSIS — I482 Chronic atrial fibrillation, unspecified: Secondary | ICD-10-CM

## 2018-03-07 DIAGNOSIS — R0602 Shortness of breath: Secondary | ICD-10-CM

## 2018-03-07 DIAGNOSIS — E7800 Pure hypercholesterolemia, unspecified: Secondary | ICD-10-CM

## 2018-03-07 DIAGNOSIS — Z79899 Other long term (current) drug therapy: Secondary | ICD-10-CM

## 2018-03-07 DIAGNOSIS — R05 Cough: Secondary | ICD-10-CM | POA: Diagnosis not present

## 2018-03-07 DIAGNOSIS — I5032 Chronic diastolic (congestive) heart failure: Secondary | ICD-10-CM

## 2018-03-07 DIAGNOSIS — R059 Cough, unspecified: Secondary | ICD-10-CM

## 2018-03-07 NOTE — Patient Instructions (Signed)
Medication Instructions:  NO CHANGES- Your physician recommends that you continue on your current medications as directed. Please refer to the Current Medication list given to you today.  If you need a refill on your cardiac medications before your next appointment, please call your pharmacy.  Labwork: BMET AND BNP TODAY HERE IN OUR OFFICE AT [facility]  Take the provided lab slips with you to the lab for your blood draw.   Testing/Procedures: A chest x-ray takes a picture of the organs and structures inside the chest, including the heart, lungs, and blood vessels. This test can show several things, including, whether the heart is enlarges; whether fluid is building up in the lungs; and whether pacemaker / defibrillator leads are still in place.  Follow-Up: Your physician wants you to follow-up in: 3 MONTHS WITH DR CRENSHAW -OR- KATHRYN LAWRENCE (NURSE PRACTIONIER), DNP,AACC IF PRIMARY CARDIOLOGIST IS UNAVAILABLE.    Thank you for choosing [facility]!!

## 2018-03-08 ENCOUNTER — Telehealth: Payer: Self-pay | Admitting: Adult Health

## 2018-03-08 LAB — BASIC METABOLIC PANEL
BUN/Creatinine Ratio: 25 (ref 12–28)
BUN: 33 mg/dL — AB (ref 8–27)
CALCIUM: 9.5 mg/dL (ref 8.7–10.3)
CHLORIDE: 99 mmol/L (ref 96–106)
CO2: 26 mmol/L (ref 20–29)
Creatinine, Ser: 1.32 mg/dL — ABNORMAL HIGH (ref 0.57–1.00)
GFR calc non Af Amer: 37 mL/min/{1.73_m2} — ABNORMAL LOW (ref >59)
GFR, EST AFRICAN AMERICAN: 42 mL/min/{1.73_m2} — AB (ref >59)
Glucose: 192 mg/dL — ABNORMAL HIGH (ref 65–99)
Potassium: 4.3 mmol/L (ref 3.5–5.2)
Sodium: 141 mmol/L (ref 134–144)

## 2018-03-08 LAB — BRAIN NATRIURETIC PEPTIDE: BNP: 200.3 pg/mL — ABNORMAL HIGH (ref 0.0–100.0)

## 2018-03-08 NOTE — Telephone Encounter (Signed)
Called patient, advised labs had not been resulted yet. We would call when they were seen by the provider.

## 2018-03-08 NOTE — Telephone Encounter (Signed)
Follow Up:     Returning a call, concerning her lab results. Please leave a detailed message if she is not at home.

## 2018-03-08 NOTE — Telephone Encounter (Signed)
Please fax

## 2018-03-08 NOTE — Telephone Encounter (Signed)
Spoke with Aggie Cosierheresa from [facility] who states once lab results from 03/07/18 has been resulted, she would like a copy faxed to facility [phone]. She also wanted to make Joni ReiningKathryn Lawrence, DNP aware that they have already received Chest xray, and pt has started on antibiotics. Routing to Colgate PalmoliveDNP and nurse.

## 2018-03-08 NOTE — Telephone Encounter (Signed)
New Message:     Aggie Cosierheresa from [facility] would like for you to fax over pt's lab results from yesterday please. Please fax this to [phone]..Marland Kitchen

## 2018-03-09 NOTE — Telephone Encounter (Signed)
Sent yesterday via epic

## 2018-03-10 ENCOUNTER — Telehealth: Payer: Self-pay | Admitting: Cardiology

## 2018-03-10 NOTE — Telephone Encounter (Signed)
New Message   Pt states she is returning a call for nurse

## 2018-03-16 ENCOUNTER — Telehealth: Payer: Self-pay

## 2018-03-16 NOTE — Telephone Encounter (Signed)
S/w Philis NettleEIleen Coble (sister and POA) she states that [facility] has given her antibiotics and she is getting better. She was wanting the other results that were done.

## 2018-03-17 NOTE — Telephone Encounter (Signed)
FAXED LABS TO [facility]

## 2018-03-17 NOTE — Telephone Encounter (Signed)
Yes. I reviewed the labs. They are improved. Feel free to print them and send to [facility].

## 2018-05-26 ENCOUNTER — Encounter: Payer: Self-pay | Admitting: Podiatry

## 2018-05-26 ENCOUNTER — Ambulatory Visit (INDEPENDENT_AMBULATORY_CARE_PROVIDER_SITE_OTHER): Payer: Medicare Other | Admitting: Podiatry

## 2018-05-26 DIAGNOSIS — L600 Ingrowing nail: Secondary | ICD-10-CM | POA: Diagnosis not present

## 2018-05-26 DIAGNOSIS — Z7901 Long term (current) use of anticoagulants: Secondary | ICD-10-CM

## 2018-05-26 DIAGNOSIS — B351 Tinea unguium: Secondary | ICD-10-CM

## 2018-05-26 DIAGNOSIS — M79675 Pain in left toe(s): Secondary | ICD-10-CM

## 2018-05-26 DIAGNOSIS — M79674 Pain in right toe(s): Secondary | ICD-10-CM

## 2018-05-28 NOTE — Progress Notes (Signed)
Subjective: 84 year old female presents the office with her daughter for concerns of ingrown toenail to the left big toe.  She states her daughter did cut a piece of toenail out.  Denies any redness or drainage or any swelling or any pus.  Overall her nails are thickened elongated and she has difficulty trimming them the cause irritation with shoes.  She has no other concerns.  She states that the facility where she lives she gets her toenails cut about every 6 months.  We have not seen her since 2016 with Dr. Stacie Acres. Denies any systemic complaints such as fevers, chills, nausea, vomiting. No acute changes since last appointment, and no other complaints at this time.   She is on Eliquis  Objective: AAO x3, NAD DP/PT pulses decreased bilaterally, CRT less than 3 seconds-there is chronic swelling bilaterally which could limit pulse exam. Sensation appears to be intact with Dorann Ou monofilament. Overall the nails are hypertrophic, dystrophic with yellow to brown discoloration.  There is incurvation to the distal portions of the medial hallux nail corners bilaterally and there is tenderness to the tips of the nail corners.  There is no edema, erythema there is no clinical signs of infection noted today. No open lesions or pre-ulcerative lesions.  No pain with calf compression, swelling, warmth, erythema  Assessment: Symptomatic onychomycosis, ingrown toenail   Plan: -All treatment options discussed with the patient including all alternatives, risks, complications.  -I discussed with a partial nail avulsion however given her decreased circulation as well as a blood thinners and there is no signs of infection I think that she would benefit more from routine debridements of her toenails except for every 6 months.  If however there is any infection or any increased pain or no improvement after nail debridement we can always do the procedure if needed.  She is in agreement to this.  I did sharply  debride the toenails x10 without any complications or bleeding today.  After debridement there is resolution of pain.  I do recommend routine debridements we will see her back in 3 months. -Patient encouraged to call the office with any questions, concerns, change in symptoms.   Vivi Barrack DPM

## 2018-06-07 ENCOUNTER — Ambulatory Visit: Payer: Medicare Other | Admitting: Cardiology

## 2018-08-29 ENCOUNTER — Ambulatory Visit: Payer: Medicare Other | Admitting: Podiatry

## 2018-11-21 ENCOUNTER — Emergency Department (HOSPITAL_COMMUNITY): Payer: Medicare Other

## 2018-11-21 ENCOUNTER — Other Ambulatory Visit: Payer: Self-pay

## 2018-11-21 ENCOUNTER — Inpatient Hospital Stay (HOSPITAL_COMMUNITY)
Admission: EM | Admit: 2018-11-21 | Discharge: 2018-11-23 | DRG: 682 | Disposition: A | Discharge status: Skilled Nursing Facility | Payer: Medicare Other | Attending: Internal Medicine | Admitting: Internal Medicine

## 2018-11-21 ENCOUNTER — Encounter (HOSPITAL_COMMUNITY): Payer: Self-pay

## 2018-11-21 DIAGNOSIS — Z6830 Body mass index (BMI) 30.0-30.9, adult: Secondary | ICD-10-CM

## 2018-11-21 DIAGNOSIS — R32 Unspecified urinary incontinence: Secondary | ICD-10-CM | POA: Diagnosis present

## 2018-11-21 DIAGNOSIS — E43 Unspecified severe protein-calorie malnutrition: Secondary | ICD-10-CM | POA: Diagnosis present

## 2018-11-21 DIAGNOSIS — F419 Anxiety disorder, unspecified: Secondary | ICD-10-CM | POA: Diagnosis present

## 2018-11-21 DIAGNOSIS — F0390 Unspecified dementia without behavioral disturbance: Secondary | ICD-10-CM | POA: Diagnosis present

## 2018-11-21 DIAGNOSIS — Z8249 Family history of ischemic heart disease and other diseases of the circulatory system: Secondary | ICD-10-CM

## 2018-11-21 DIAGNOSIS — I509 Heart failure, unspecified: Secondary | ICD-10-CM

## 2018-11-21 DIAGNOSIS — Z7901 Long term (current) use of anticoagulants: Secondary | ICD-10-CM

## 2018-11-21 DIAGNOSIS — G4700 Insomnia, unspecified: Secondary | ICD-10-CM | POA: Diagnosis present

## 2018-11-21 DIAGNOSIS — Z794 Long term (current) use of insulin: Secondary | ICD-10-CM

## 2018-11-21 DIAGNOSIS — M810 Age-related osteoporosis without current pathological fracture: Secondary | ICD-10-CM | POA: Diagnosis present

## 2018-11-21 DIAGNOSIS — E785 Hyperlipidemia, unspecified: Secondary | ICD-10-CM | POA: Diagnosis present

## 2018-11-21 DIAGNOSIS — Z9102 Food additives allergy status: Secondary | ICD-10-CM

## 2018-11-21 DIAGNOSIS — H409 Unspecified glaucoma: Secondary | ICD-10-CM | POA: Diagnosis present

## 2018-11-21 DIAGNOSIS — R0609 Other forms of dyspnea: Secondary | ICD-10-CM | POA: Diagnosis present

## 2018-11-21 DIAGNOSIS — E1136 Type 2 diabetes mellitus with diabetic cataract: Secondary | ICD-10-CM | POA: Diagnosis present

## 2018-11-21 DIAGNOSIS — I480 Paroxysmal atrial fibrillation: Secondary | ICD-10-CM | POA: Diagnosis present

## 2018-11-21 DIAGNOSIS — Z886 Allergy status to analgesic agent status: Secondary | ICD-10-CM

## 2018-11-21 DIAGNOSIS — E1122 Type 2 diabetes mellitus with diabetic chronic kidney disease: Secondary | ICD-10-CM | POA: Diagnosis present

## 2018-11-21 DIAGNOSIS — R0600 Dyspnea, unspecified: Secondary | ICD-10-CM | POA: Diagnosis present

## 2018-11-21 DIAGNOSIS — I5043 Acute on chronic combined systolic (congestive) and diastolic (congestive) heart failure: Secondary | ICD-10-CM | POA: Diagnosis present

## 2018-11-21 DIAGNOSIS — N179 Acute kidney failure, unspecified: Secondary | ICD-10-CM | POA: Diagnosis not present

## 2018-11-21 DIAGNOSIS — J9811 Atelectasis: Secondary | ICD-10-CM | POA: Diagnosis present

## 2018-11-21 DIAGNOSIS — N183 Chronic kidney disease, stage 3 (moderate): Secondary | ICD-10-CM | POA: Diagnosis present

## 2018-11-21 DIAGNOSIS — D509 Iron deficiency anemia, unspecified: Secondary | ICD-10-CM | POA: Diagnosis present

## 2018-11-21 DIAGNOSIS — E119 Type 2 diabetes mellitus without complications: Secondary | ICD-10-CM

## 2018-11-21 DIAGNOSIS — I130 Hypertensive heart and chronic kidney disease with heart failure and stage 1 through stage 4 chronic kidney disease, or unspecified chronic kidney disease: Secondary | ICD-10-CM | POA: Diagnosis present

## 2018-11-21 DIAGNOSIS — I700 Atherosclerosis of aorta: Secondary | ICD-10-CM | POA: Diagnosis present

## 2018-11-21 DIAGNOSIS — Z833 Family history of diabetes mellitus: Secondary | ICD-10-CM

## 2018-11-21 DIAGNOSIS — F329 Major depressive disorder, single episode, unspecified: Secondary | ICD-10-CM | POA: Diagnosis present

## 2018-11-21 DIAGNOSIS — J189 Pneumonia, unspecified organism: Secondary | ICD-10-CM | POA: Diagnosis present

## 2018-11-21 DIAGNOSIS — Z87891 Personal history of nicotine dependence: Secondary | ICD-10-CM

## 2018-11-21 DIAGNOSIS — J440 Chronic obstructive pulmonary disease with acute lower respiratory infection: Secondary | ICD-10-CM | POA: Diagnosis present

## 2018-11-21 DIAGNOSIS — N3281 Overactive bladder: Secondary | ICD-10-CM | POA: Diagnosis present

## 2018-11-21 DIAGNOSIS — Z79891 Long term (current) use of opiate analgesic: Secondary | ICD-10-CM

## 2018-11-21 DIAGNOSIS — Z91048 Other nonmedicinal substance allergy status: Secondary | ICD-10-CM

## 2018-11-21 DIAGNOSIS — J069 Acute upper respiratory infection, unspecified: Secondary | ICD-10-CM

## 2018-11-21 DIAGNOSIS — E876 Hypokalemia: Secondary | ICD-10-CM | POA: Diagnosis present

## 2018-11-21 DIAGNOSIS — H04129 Dry eye syndrome of unspecified lacrimal gland: Secondary | ICD-10-CM | POA: Diagnosis present

## 2018-11-21 DIAGNOSIS — Z20828 Contact with and (suspected) exposure to other viral communicable diseases: Secondary | ICD-10-CM | POA: Diagnosis present

## 2018-11-21 DIAGNOSIS — E539 Vitamin B deficiency, unspecified: Secondary | ICD-10-CM | POA: Diagnosis present

## 2018-11-21 DIAGNOSIS — Z806 Family history of leukemia: Secondary | ICD-10-CM

## 2018-11-21 DIAGNOSIS — Z79899 Other long term (current) drug therapy: Secondary | ICD-10-CM

## 2018-11-21 DIAGNOSIS — Z66 Do not resuscitate: Secondary | ICD-10-CM | POA: Diagnosis present

## 2018-11-21 DIAGNOSIS — I4510 Unspecified right bundle-branch block: Secondary | ICD-10-CM | POA: Diagnosis present

## 2018-11-21 DIAGNOSIS — K219 Gastro-esophageal reflux disease without esophagitis: Secondary | ICD-10-CM | POA: Diagnosis present

## 2018-11-21 LAB — LACTATE DEHYDROGENASE: LDH: 252 U/L — ABNORMAL HIGH (ref 98–192)

## 2018-11-21 LAB — FIBRINOGEN: Fibrinogen: 557 mg/dL — ABNORMAL HIGH (ref 210–475)

## 2018-11-21 LAB — CBC WITH DIFFERENTIAL/PLATELET
Abs Immature Granulocytes: 0.06 10*3/uL (ref 0.00–0.07)
Basophils Absolute: 0.1 10*3/uL (ref 0.0–0.1)
Basophils Relative: 1 %
Eosinophils Absolute: 0.5 10*3/uL (ref 0.0–0.5)
Eosinophils Relative: 5 %
HCT: 31.3 % — ABNORMAL LOW (ref 36.0–46.0)
Hemoglobin: 9.4 g/dL — ABNORMAL LOW (ref 12.0–15.0)
Immature Granulocytes: 1 %
Lymphocytes Relative: 12 %
Lymphs Abs: 1.3 10*3/uL (ref 0.7–4.0)
MCH: 27 pg (ref 26.0–34.0)
MCHC: 30 g/dL (ref 30.0–36.0)
MCV: 89.9 fL (ref 80.0–100.0)
Monocytes Absolute: 0.8 10*3/uL (ref 0.1–1.0)
Monocytes Relative: 7 %
Neutro Abs: 7.9 10*3/uL — ABNORMAL HIGH (ref 1.7–7.7)
Neutrophils Relative %: 74 %
Platelets: 217 10*3/uL (ref 150–400)
RBC: 3.48 MIL/uL — ABNORMAL LOW (ref 3.87–5.11)
RDW: 17.2 % — ABNORMAL HIGH (ref 11.5–15.5)
WBC: 10.6 10*3/uL — ABNORMAL HIGH (ref 4.0–10.5)
nRBC: 0 % (ref 0.0–0.2)

## 2018-11-21 LAB — COMPREHENSIVE METABOLIC PANEL
ALT: 35 U/L (ref 0–44)
AST: 62 U/L — ABNORMAL HIGH (ref 15–41)
Albumin: 2.7 g/dL — ABNORMAL LOW (ref 3.5–5.0)
Alkaline Phosphatase: 95 U/L (ref 38–126)
Anion gap: 12 (ref 5–15)
BUN: 83 mg/dL — ABNORMAL HIGH (ref 8–23)
CO2: 27 mmol/L (ref 22–32)
Calcium: 9.2 mg/dL (ref 8.9–10.3)
Chloride: 103 mmol/L (ref 98–111)
Creatinine, Ser: 2.12 mg/dL — ABNORMAL HIGH (ref 0.44–1.00)
GFR calc Af Amer: 24 mL/min — ABNORMAL LOW (ref >60)
GFR calc non Af Amer: 21 mL/min — ABNORMAL LOW (ref >60)
Glucose, Bld: 88 mg/dL (ref 70–99)
Potassium: 4.1 mmol/L (ref 3.5–5.1)
Sodium: 142 mmol/L (ref 135–145)
Total Bilirubin: 0.4 mg/dL (ref 0.3–1.2)
Total Protein: 8.2 g/dL — ABNORMAL HIGH (ref 6.5–8.1)

## 2018-11-21 LAB — FERRITIN: Ferritin: 58 ng/mL (ref 11–307)

## 2018-11-21 LAB — BRAIN NATRIURETIC PEPTIDE: B Natriuretic Peptide: 479.7 pg/mL — ABNORMAL HIGH (ref 0.0–100.0)

## 2018-11-21 LAB — SARS CORONAVIRUS 2 BY RT PCR (HOSPITAL ORDER, PERFORMED IN ~~LOC~~ HOSPITAL LAB): SARS Coronavirus 2: NEGATIVE

## 2018-11-21 LAB — D-DIMER, QUANTITATIVE: D-Dimer, Quant: 0.7 ug/mL-FEU — ABNORMAL HIGH (ref 0.00–0.50)

## 2018-11-21 LAB — PROCALCITONIN: Procalcitonin: <0.1 ng/mL

## 2018-11-21 LAB — LACTIC ACID, PLASMA: Lactic Acid, Venous: 1.4 mmol/L (ref 0.5–1.9)

## 2018-11-21 LAB — C-REACTIVE PROTEIN: CRP: 9.8 mg/dL — ABNORMAL HIGH (ref <1.0)

## 2018-11-21 LAB — TRIGLYCERIDES: Triglycerides: 40 mg/dL (ref <150)

## 2018-11-21 MED ORDER — SODIUM CHLORIDE 0.9 % IV BOLUS (SEPSIS)
500.0000 mL | Freq: Once | INTRAVENOUS | Status: AC
  Administered 2018-11-21: 500 mL via INTRAVENOUS

## 2018-11-21 MED ORDER — SODIUM CHLORIDE 0.9 % IV SOLN
1000.0000 mL | INTRAVENOUS | Status: DC
  Administered 2018-11-22: 1000 mL via INTRAVENOUS

## 2018-11-21 MED ORDER — SODIUM CHLORIDE 0.9 % IV SOLN: 1000.0000 mL | INTRAVENOUS | Status: DC

## 2018-11-21 NOTE — ED Triage Notes (Signed)
Pt here from [facility] for exertional SOB.  Pt via [facility] with Nasal O2, SPO2 100% on 4 LPM.

## 2018-11-21 NOTE — ED Provider Notes (Signed)
[facility] EMERGENCY DEPARTMENT Provider Note   CSN: [id number] Arrival date & time: 11/21/18  1950    History   Chief Complaint Chief Complaint  Patient presents with  . Shortness of Breath    HPI Jocelyn Schultz is a 85 y.o. female.     HPI Patient resented to the ED for evaluation of cough and shortness of breath.  Patient states she has had difficulty with some cough and shortness of breath the last few days.  Patient not think there was much of a change today.  She has been using oxygen at the nursing facility.  Denies any fevers.  She is noticed some mild leg swelling but that is not new for her.  She denies any chest pain.  No vomiting or diarrhea.  No change in her appetite.  Patient is not sure why the nursing facility sent her to the ED today.  According to the EMS report they noticed the patient was getting more short of breath with ambulation so they sent her to the ED for evaluation. Past Medical History:  Diagnosis Date  . Acute bronchitis    hx of   . Age-related physical debility   . Allergic rhinitis   . Anemia    iron deficiency per H7P from [facility]  . Asthma    per H&P from [facility]  . Cataract   . CHF (congestive heart failure) (HCC)    acute systolic (congestive)heart failure per H&P from [facility]  . Complication of anesthesia    per sister, states daughter said she is hard to wake up  . Constipation   . COPD (chronic obstructive pulmonary disease) (HCC)    per H&P from [facility]  . Dementia (HCC)    from progress note from [facility]  . Depression    per H&P from [facility]  . DM2 (diabetes mellitus, type 2) (HCC)   . Dry beriberi   . Dry eye syndrome   . Dyspnea    per H&P from [facility]  . Dysrhythmia    h/o SVT  and atrial flutter from H&P from [facility]  . GERD (gastroesophageal reflux disease)    per H&P from [facility]   . Glaucoma   . Headache    from progress note from [facility]  . HTN (hypertension)    per H&P from [facility]  . Hyperlipidemia   . Hypokalemia    hx of   . Insomnia   . Multifocal atrial tachycardia (HCC)   . Osteoporosis   . Overactive bladder   . Vitamin B deficiency     Patient Active Problem List   Diagnosis Date Noted  . Cardiomyopathy (HCC) 12/30/2015  . Syncope 04/02/2015  . Diabetes (HCC) 04/01/2015  . CHF (congestive heart failure) (HCC) 04/01/2015  . HTN (hypertension) 04/01/2015  . Hypomagnesemia 04/01/2015  . Hypokalemia 04/01/2015  . Syncope and collapse   . Multifocal atrial tachycardia (HCC)   . Congestive heart disease [facility])     Past Surgical History:  Procedure Laterality Date  . ANKLE SURGERY Left   . ARTERY BIOPSY Right 04/29/2017   Procedure: RIGHT TEMPORAL ARTERY BIOPSY;  Surgeon: [name], MD;  Location: [facility]S;  Service: General;  Laterality: Right;  . DILATION AND CURETTAGE OF UTERUS    . DILATION AND CURETTAGE, DIAGNOSTIC / THERAPEUTIC    . EYE SURGERY  2011, 2012  .  FOOT SURGERY Left   . HAND SURGERY Left   . KNEE SURGERY Left 2011     OB History   No obstetric history on file.      Home Medications    Prior to Admission medications   Medication Sig Start Date End Date Taking? Authorizing Provider  albuterol (VENTOLIN HFA) 108 (90 Base) MCG/ACT inhaler Inhale 2 puffs into the lungs every 4 (four) hours as needed for wheezing or shortness of breath.   Yes [provider]  amoxicillin-clavulanate (AUGMENTIN) 500-125 MG tablet Take 1 tablet by mouth 3 (three) times daily. For 7 days 11/18/18  Yes [provider]  brimonidine (ALPHAGAN) 0.2 % ophthalmic solution Place 1 drop into both eyes 2 (two) times daily.    Yes [provider]  Carboxymethylcellul-Glycerin (REFRESH OPTIVE OP) Place 1 drop into both eyes 4 (four) times daily.    Yes [provider]  doxycycline  (VIBRA-TABS) 100 MG tablet Take 100 mg by mouth 2 (two) times daily. For 7 days 11/18/18  Yes [provider]  ELIQUIS 2.5 MG TABS tablet Take 1 tablet by mouth 2 (two) times daily.    Yes [provider]  ferrous sulfate 325 (65 FE) MG tablet Take 325 mg by mouth 2 (two) times a day.    Yes [provider]  fluticasone furoate-vilanterol (BREO ELLIPTA) 100-25 MCG/INH AEPB Inhale 1 puff into the lungs daily.   Yes [provider]  guaiFENesin-dextromethorphan (ROBITUSSIN DM) 100-10 MG/5ML syrup Take 10 mLs by mouth every 4 (four) hours as needed for cough.   Yes [provider]  hydrALAZINE (APRESOLINE) 50 MG tablet Take 50 mg by mouth 3 (three) times daily.  02/25/17  Yes [provider]  Insulin Detemir (LEVEMIR FLEXTOUCH [place]) Inject 35 Units into the skin every morning.   Yes [provider]  insulin detemir (LEVEMIR) 100 UNIT/ML injection Inject 5 Units into the skin at bedtime.    Yes [provider]  insulin lispro (HUMALOG) 100 UNIT/ML injection Inject 14 Units into the skin 3 (three) times daily before meals.    Yes [provider]  loratadine (CLARITIN) 10 MG tablet Take 10 mg by mouth daily.   Yes [provider]  magnesium oxide (MAG-OX) 400 (241.3 MG) MG tablet Take 1 tablet (400 mg total) by mouth 2 (two) times daily. Patient taking differently: Take 400 mg by mouth daily.  04/02/15  Yes Osvaldo Shipper, MD  metoprolol tartrate (LOPRESSOR) 50 MG tablet Take 1 tablet (50 mg total) by mouth 2 (two) times daily. Note dose increased Patient taking differently: Take 50 mg by mouth 2 (two) times daily. Hold if heart rate is less than 60 BPM 05/24/17  Yes Crenshaw, Madolyn Frieze, MD  mirabegron ER (MYRBETRIQ) 50 MG TB24 tablet Take 50 mg by mouth daily. (0900)   Yes [provider]  montelukast (SINGULAIR) 10 MG tablet Take 10 mg by mouth daily.   Yes [provider]  pantoprazole (PROTONIX) 40 MG  tablet Take 40 mg by mouth daily.   Yes [provider]  POLYETHYLENE GLYCOL 3350 PO Take 17 g by mouth every other day.    Yes [provider]  potassium chloride SA (K-DUR,KLOR-CON) 20 MEQ tablet Take 40 mEq by mouth daily.    Yes [provider]  prednisoLONE acetate (PRED FORTE) 1 % ophthalmic suspension Place 1 drop into the right eye 2 (two) times daily.    Yes [provider]  sertraline (ZOLOFT)  100 MG tablet Take 1 tablet by mouth every morning. 02/02/18  Yes [provider]  tamsulosin (FLOMAX) 0.4 MG CAPS capsule Take 0.4 mg by mouth daily.    Yes [provider]  torsemide (DEMADEX) 20 MG tablet Take 60 mg by mouth daily.    Yes [provider]  traMADol (ULTRAM) 50 MG tablet Take 25 mg by mouth every 8 (eight) hours as needed (FOR PAIN.).    Yes [provider]  vitamin B-12 (CYANOCOBALAMIN) 1000 MCG tablet Take 1,000 mcg by mouth See admin instructions. Take 1 tablet by mouth every 2 days   Yes [provider]  vitamin C (ASCORBIC ACID) 500 MG tablet Take 500 mg by mouth daily.    Yes [provider]  Vitamin D, Ergocalciferol, (DRISDOL) 50000 units CAPS capsule Take 50,000 Units by mouth every 30 (thirty) days.    Yes [provider]  zaleplon (SONATA) 5 MG capsule Take 5 mg by mouth at bedtime.    Yes [provider]  oxyCODONE (OXY IR/ROXICODONE) 5 MG immediate release tablet Take 0.5-1 tablets (2.5-5 mg total) by mouth every 6 (six) hours as needed for moderate pain, severe pain or breakthrough pain. Patient not taking: Reported on 11/21/2018 04/29/17   Sherrie GeorgeJennings, Willard, PA-C    Family History Family History  Problem Relation Age of Onset  . Leukemia Mother   . Heart attack Father   . Heart Problems Brother   . Other Brother        kidney problems  . Diabetes Mellitus II Sister     Social History Social History   Tobacco Use  . Smoking status: Former Smoker     Types: Cigarettes    Last attempt to quit: 05/13/1987    Years since quitting: 31.5  . Smokeless tobacco: Never Used  Substance Use Topics  . Alcohol use: Yes    Alcohol/week: 0.0 standard drinks    Comment: very rare (wine at christmas)  . Drug use: No    Frequency: 5.0 times per week     Allergies   Aspirin; Saccharin; and Soap   Review of Systems Review of Systems  All other systems reviewed and are negative.    Physical Exam Updated Vital Signs BP (!) 145/125 (BP Location: Left Arm)   Pulse 60   Temp 98.6 F (37 C) (Oral)   Resp (!) 25   Ht 1.575 m (5\' 2" )   Wt 74.8 kg   SpO2 97%   BMI 30.18 kg/m   Physical Exam Vitals signs and nursing note reviewed.  Constitutional:      General: She is not in acute distress.    Appearance: She is well-developed.     Comments: Elderly  HENT:     Head: Normocephalic and atraumatic.     Right Ear: External ear normal.     Left Ear: External ear normal.  Eyes:     General: No scleral icterus.       Right eye: No discharge.        Left eye: No discharge.     Conjunctiva/sclera: Conjunctivae normal.  Neck:     Musculoskeletal: Neck supple.     Trachea: No tracheal deviation.  Cardiovascular:     Rate and Rhythm: Normal rate and regular rhythm.  Pulmonary:     Effort: Pulmonary effort is normal. No respiratory distress.     Breath sounds: No stridor. Rales present.     Comments: Crackles on exam at the bases  Abdominal:     General: Bowel sounds are normal. There is no distension.     Palpations: Abdomen is soft.     Tenderness: There is no abdominal tenderness. There is no guarding or rebound.  Musculoskeletal:        General: No tenderness.  Skin:    General: Skin is warm and dry.     Findings: No rash.  Neurological:     Mental Status: She is alert.     Cranial Nerves: No cranial nerve deficit (no facial droop, extraocular movements intact, no slurred speech).     Sensory: No sensory deficit.     Motor: No  abnormal muscle tone or seizure activity.     Coordination: Coordination normal.      ED Treatments / Results  Labs (all labs ordered are listed, but only abnormal results are displayed) Labs Reviewed  CBC WITH DIFFERENTIAL/PLATELET - Abnormal; Notable for the following components:      Result Value   WBC 10.6 (*)    RBC 3.48 (*)    Hemoglobin 9.4 (*)    HCT 31.3 (*)    RDW 17.2 (*)    Neutro Abs 7.9 (*)    All other components within normal limits  COMPREHENSIVE METABOLIC PANEL - Abnormal; Notable for the following components:   BUN 83 (*)    Creatinine, Ser 2.12 (*)    Total Protein 8.2 (*)    Albumin 2.7 (*)    AST 62 (*)    GFR calc non Af Amer 21 (*)    GFR calc Af Amer 24 (*)    All other components within normal limits  D-DIMER, QUANTITATIVE (NOT AT [facility]) - Abnormal; Notable for the following components:   D-Dimer, Quant 0.70 (*)    All other components within normal limits  LACTATE DEHYDROGENASE - Abnormal; Notable for the following components:   LDH 252 (*)    All other components within normal limits  FIBRINOGEN - Abnormal; Notable for the following components:   Fibrinogen 557 (*)    All other components within normal limits  C-REACTIVE PROTEIN - Abnormal; Notable for the following components:   CRP 9.8 (*)    All other components within normal limits  BRAIN NATRIURETIC PEPTIDE - Abnormal; Notable for the following components:   B Natriuretic Peptide 479.7 (*)    All other components within normal limits  SARS CORONAVIRUS 2 (HOSPITAL ORDER, PERFORMED IN [place] [facility])  CULTURE, BLOOD (ROUTINE X 2)  CULTURE, BLOOD (ROUTINE X 2)  LACTIC ACID, PLASMA  PROCALCITONIN  FERRITIN  TRIGLYCERIDES  LACTIC ACID, PLASMA    EKG EKG Interpretation  Date/Time:  Tuesday November 21 2018 19:53:45 EDT Ventricular Rate:  60 PR Interval:    QRS Duration: 149 QT Interval:  472 QTC Calculation: 472 R Axis:   82 Text Interpretation:  Sinus rhythm Atrial  premature complex Right bundle branch block Since last tracing rate slower Confirmed by Linwood Dibbles [phone]) on 11/21/2018 8:09:54 PM   Radiology Dg Chest Port 1 View  Result Date: 11/21/2018 CLINICAL DATA:  Shortness of breath, cough, history COPD, CHF, multifocal atrial tachycardia, hypertension, diabetes mellitus, asthma, former smoker EXAM: PORTABLE CHEST 1 VIEW COMPARISON:  Portable exam 2031 hours compared to 03/07/2018 FINDINGS: Borderline enlargement of cardiac silhouette. Mediastinal contours and pulmonary vascularity normal. Minimal chronic interstitial prominence. Mild bibasilar subsegmental atelectasis. Chronic central peribronchial thickening, slightly decreased. No definite acute infiltrate, pleural effusion or pneumothorax. Bones demineralized. IMPRESSION: Bibasilar atelectasis without definite acute  infiltrate. Electronically Signed   By: Ulyses Southward M.D.   On: 11/21/2018 20:41    Procedures Procedures (including critical care time)  Medications Ordered in ED Medications  0.9 %  sodium chloride infusion (1,000 mLs Intravenous Not Given 11/21/18 2344)  sodium chloride 0.9 % bolus 500 mL (has no administration in time range)    Followed by  0.9 %  sodium chloride infusion (has no administration in time range)     Initial Impression / Assessment and Plan / ED Course  I have reviewed the triage vital signs and the nursing notes.  Pertinent labs & imaging results that were available during my care of the patient were reviewed by me and considered in my medical decision making (see chart for details).  Clinical Course as of Nov 21 2343  Tue Nov 21, 2018  2012 Sat 100 on 2l   [JK]  2321 Labs are notable for slight increase in BNP but no signs of significant pulmonary edema.  Lactic acid level is normal.  D-dimer is 0.7 which is normal in the age adjustment.  With test is negative.  Chest x-ray without pneumonia.   [JK]    Clinical Course User Index [JK] Linwood Dibbles, MD      Patient presented to emergency room for evaluation of cough congestion.  Patient is stable on 2 L nasal cannula oxygen.  She is not tachypneic.  Vital signs are reassuring.  Work-up does not show evidence of pneumonia.  BNP is slightly elevated but no signs of significant pulmonary edema.  Patient's creatinine and BUN are elevated however compared to previous values.  She does have elevated CRP and fibrinogen.  Consider discharge back to the nursing facility but I think she would benefit from IV fluid hydration and close monitoring.  Jocelyn Schultz was evaluated in Emergency Department on 11/21/2018 for the symptoms described in the history of present illness. She was evaluated in the context of the global COVID-19 pandemic, which necessitated consideration that the patient might be at risk for infection with the SARS-CoV-2 virus that causes COVID-19. Institutional protocols and algorithms that pertain to the evaluation of patients at risk for COVID-19 are in a state of rapid change based on information released by regulatory bodies including the CDC and federal and state organizations. These policies and algorithms were followed during the patient's care in the ED.   Final Clinical Impressions(s) / ED Diagnoses   Final diagnoses:  Upper respiratory tract infection, unspecified type  AKI (acute kidney injury) (HCC)     Linwood Dibbles, MD 11/21/18 [phone]

## 2018-11-21 NOTE — Discharge Instructions (Signed)
Continue your current medications, follow-up with your doctor in the urologist

## 2018-11-22 ENCOUNTER — Observation Stay (HOSPITAL_COMMUNITY): Payer: Medicare Other

## 2018-11-22 ENCOUNTER — Encounter (HOSPITAL_COMMUNITY): Payer: Self-pay | Admitting: Internal Medicine

## 2018-11-22 DIAGNOSIS — R0602 Shortness of breath: Secondary | ICD-10-CM

## 2018-11-22 DIAGNOSIS — E119 Type 2 diabetes mellitus without complications: Secondary | ICD-10-CM | POA: Diagnosis not present

## 2018-11-22 DIAGNOSIS — J440 Chronic obstructive pulmonary disease with acute lower respiratory infection: Secondary | ICD-10-CM | POA: Diagnosis present

## 2018-11-22 DIAGNOSIS — Z794 Long term (current) use of insulin: Secondary | ICD-10-CM

## 2018-11-22 DIAGNOSIS — J9811 Atelectasis: Secondary | ICD-10-CM | POA: Diagnosis present

## 2018-11-22 DIAGNOSIS — E43 Unspecified severe protein-calorie malnutrition: Secondary | ICD-10-CM

## 2018-11-22 DIAGNOSIS — E785 Hyperlipidemia, unspecified: Secondary | ICD-10-CM | POA: Diagnosis present

## 2018-11-22 DIAGNOSIS — H04129 Dry eye syndrome of unspecified lacrimal gland: Secondary | ICD-10-CM | POA: Diagnosis present

## 2018-11-22 DIAGNOSIS — E539 Vitamin B deficiency, unspecified: Secondary | ICD-10-CM | POA: Diagnosis present

## 2018-11-22 DIAGNOSIS — I361 Nonrheumatic tricuspid (valve) insufficiency: Secondary | ICD-10-CM

## 2018-11-22 DIAGNOSIS — N179 Acute kidney failure, unspecified: Secondary | ICD-10-CM | POA: Diagnosis not present

## 2018-11-22 DIAGNOSIS — Z20828 Contact with and (suspected) exposure to other viral communicable diseases: Secondary | ICD-10-CM | POA: Diagnosis present

## 2018-11-22 DIAGNOSIS — G4700 Insomnia, unspecified: Secondary | ICD-10-CM | POA: Diagnosis present

## 2018-11-22 DIAGNOSIS — K219 Gastro-esophageal reflux disease without esophagitis: Secondary | ICD-10-CM | POA: Diagnosis present

## 2018-11-22 DIAGNOSIS — N170 Acute kidney failure with tubular necrosis: Secondary | ICD-10-CM | POA: Diagnosis not present

## 2018-11-22 DIAGNOSIS — I4510 Unspecified right bundle-branch block: Secondary | ICD-10-CM | POA: Diagnosis present

## 2018-11-22 DIAGNOSIS — J189 Pneumonia, unspecified organism: Secondary | ICD-10-CM | POA: Diagnosis present

## 2018-11-22 DIAGNOSIS — I480 Paroxysmal atrial fibrillation: Secondary | ICD-10-CM | POA: Diagnosis present

## 2018-11-22 DIAGNOSIS — N3281 Overactive bladder: Secondary | ICD-10-CM | POA: Diagnosis present

## 2018-11-22 DIAGNOSIS — Z66 Do not resuscitate: Secondary | ICD-10-CM | POA: Diagnosis present

## 2018-11-22 DIAGNOSIS — I340 Nonrheumatic mitral (valve) insufficiency: Secondary | ICD-10-CM

## 2018-11-22 DIAGNOSIS — R0600 Dyspnea, unspecified: Secondary | ICD-10-CM

## 2018-11-22 DIAGNOSIS — M810 Age-related osteoporosis without current pathological fracture: Secondary | ICD-10-CM | POA: Diagnosis present

## 2018-11-22 DIAGNOSIS — I5043 Acute on chronic combined systolic (congestive) and diastolic (congestive) heart failure: Secondary | ICD-10-CM | POA: Diagnosis present

## 2018-11-22 DIAGNOSIS — E1136 Type 2 diabetes mellitus with diabetic cataract: Secondary | ICD-10-CM | POA: Diagnosis present

## 2018-11-22 DIAGNOSIS — N183 Chronic kidney disease, stage 3 (moderate): Secondary | ICD-10-CM | POA: Diagnosis present

## 2018-11-22 DIAGNOSIS — I130 Hypertensive heart and chronic kidney disease with heart failure and stage 1 through stage 4 chronic kidney disease, or unspecified chronic kidney disease: Secondary | ICD-10-CM | POA: Diagnosis present

## 2018-11-22 DIAGNOSIS — E1122 Type 2 diabetes mellitus with diabetic chronic kidney disease: Secondary | ICD-10-CM | POA: Diagnosis present

## 2018-11-22 DIAGNOSIS — E876 Hypokalemia: Secondary | ICD-10-CM | POA: Diagnosis present

## 2018-11-22 DIAGNOSIS — F0390 Unspecified dementia without behavioral disturbance: Secondary | ICD-10-CM | POA: Diagnosis present

## 2018-11-22 DIAGNOSIS — R32 Unspecified urinary incontinence: Secondary | ICD-10-CM | POA: Diagnosis present

## 2018-11-22 LAB — CBC
HCT: 33.9 % — ABNORMAL LOW (ref 36.0–46.0)
Hemoglobin: 10.3 g/dL — ABNORMAL LOW (ref 12.0–15.0)
MCH: 27.2 pg (ref 26.0–34.0)
MCHC: 30.4 g/dL (ref 30.0–36.0)
MCV: 89.4 fL (ref 80.0–100.0)
Platelets: 209 10*3/uL (ref 150–400)
RBC: 3.79 MIL/uL — ABNORMAL LOW (ref 3.87–5.11)
RDW: 17.2 % — ABNORMAL HIGH (ref 11.5–15.5)
WBC: 10.8 10*3/uL — ABNORMAL HIGH (ref 4.0–10.5)
nRBC: 0 % (ref 0.0–0.2)

## 2018-11-22 LAB — URINALYSIS, ROUTINE W REFLEX MICROSCOPIC
Bilirubin Urine: NEGATIVE
Glucose, UA: NEGATIVE mg/dL
Hgb urine dipstick: NEGATIVE
Ketones, ur: NEGATIVE mg/dL
Leukocytes,Ua: NEGATIVE
Nitrite: NEGATIVE
Protein, ur: NEGATIVE mg/dL
Specific Gravity, Urine: 1.012 (ref 1.005–1.030)
pH: 5 (ref 5.0–8.0)

## 2018-11-22 LAB — COMPREHENSIVE METABOLIC PANEL
ALT: 38 U/L (ref 0–44)
AST: 49 U/L — ABNORMAL HIGH (ref 15–41)
Albumin: 2.9 g/dL — ABNORMAL LOW (ref 3.5–5.0)
Alkaline Phosphatase: 100 U/L (ref 38–126)
Anion gap: 12 (ref 5–15)
BUN: 78 mg/dL — ABNORMAL HIGH (ref 8–23)
CO2: 25 mmol/L (ref 22–32)
Calcium: 9.2 mg/dL (ref 8.9–10.3)
Chloride: 105 mmol/L (ref 98–111)
Creatinine, Ser: 1.86 mg/dL — ABNORMAL HIGH (ref 0.44–1.00)
GFR calc Af Amer: 28 mL/min — ABNORMAL LOW (ref >60)
GFR calc non Af Amer: 24 mL/min — ABNORMAL LOW (ref >60)
Glucose, Bld: 90 mg/dL (ref 70–99)
Potassium: 4.1 mmol/L (ref 3.5–5.1)
Sodium: 142 mmol/L (ref 135–145)
Total Bilirubin: 0.5 mg/dL (ref 0.3–1.2)
Total Protein: 8.5 g/dL — ABNORMAL HIGH (ref 6.5–8.1)

## 2018-11-22 LAB — CK TOTAL AND CKMB (NOT AT ARMC)
CK, MB: 1.3 ng/mL (ref 0.5–5.0)
Relative Index: INVALID (ref 0.0–2.5)
Total CK: 27 U/L — ABNORMAL LOW (ref 38–234)

## 2018-11-22 LAB — VITAMIN B12: Vitamin B-12: 850 pg/mL (ref 180–914)

## 2018-11-22 LAB — SEDIMENTATION RATE: Sed Rate: 127 mm/hr — ABNORMAL HIGH (ref 0–22)

## 2018-11-22 LAB — ECHOCARDIOGRAM LIMITED
Height: 62 in
Weight: 2640 oz

## 2018-11-22 LAB — IRON AND TIBC
Iron: 16 ug/dL — ABNORMAL LOW (ref 28–170)
Saturation Ratios: 5 % — ABNORMAL LOW (ref 10.4–31.8)
TIBC: 333 ug/dL (ref 250–450)
UIBC: 317 ug/dL

## 2018-11-22 LAB — GLUCOSE, CAPILLARY: Glucose-Capillary: 153 mg/dL — ABNORMAL HIGH (ref 70–99)

## 2018-11-22 LAB — MRSA PCR SCREENING: MRSA by PCR: NEGATIVE

## 2018-11-22 LAB — TROPONIN I: Troponin I: 0.05 ng/mL (ref <0.03)

## 2018-11-22 MED ORDER — ALBUTEROL SULFATE (2.5 MG/3ML) 0.083% IN NEBU: 2.5000 mg | INHALATION_SOLUTION | Freq: Four times a day (QID) | RESPIRATORY_TRACT | Status: DC | PRN

## 2018-11-22 MED ORDER — PREDNISOLONE ACETATE 1 % OP SUSP
1.0000 [drp] | Freq: Two times a day (BID) | OPHTHALMIC | Status: DC
  Administered 2018-11-22 – 2018-11-23 (×3): 1 [drp] via OPHTHALMIC
  Filled 2018-11-22: qty 5

## 2018-11-22 MED ORDER — POLYVINYL ALCOHOL 1.4 % OP SOLN
1.0000 [drp] | Freq: Four times a day (QID) | OPHTHALMIC | Status: DC
  Administered 2018-11-22 – 2018-11-23 (×5): 1 [drp] via OPHTHALMIC
  Filled 2018-11-22: qty 15

## 2018-11-22 MED ORDER — SODIUM CHLORIDE 0.9% FLUSH: 3.0000 mL | INTRAVENOUS | Status: DC | PRN

## 2018-11-22 MED ORDER — ACETAMINOPHEN 650 MG RE SUPP: 650.0000 mg | Freq: Four times a day (QID) | RECTAL | Status: DC | PRN

## 2018-11-22 MED ORDER — ALBUTEROL SULFATE (2.5 MG/3ML) 0.083% IN NEBU
2.5000 mg | INHALATION_SOLUTION | Freq: Three times a day (TID) | RESPIRATORY_TRACT | Status: DC
  Administered 2018-11-22 – 2018-11-23 (×4): 2.5 mg via RESPIRATORY_TRACT
  Filled 2018-11-22 (×4): qty 3

## 2018-11-22 MED ORDER — TORSEMIDE 20 MG PO TABS
60.0000 mg | ORAL_TABLET | Freq: Every day | ORAL | Status: DC
  Administered 2018-11-22 – 2018-11-23 (×2): 60 mg via ORAL
  Filled 2018-11-22 (×2): qty 3

## 2018-11-22 MED ORDER — PRO-STAT SUGAR FREE PO LIQD
30.0000 mL | Freq: Two times a day (BID) | ORAL | Status: DC
  Administered 2018-11-22 – 2018-11-23 (×4): 30 mL via ORAL
  Filled 2018-11-22 (×4): qty 30

## 2018-11-22 MED ORDER — HYDRALAZINE HCL 50 MG PO TABS
50.0000 mg | ORAL_TABLET | Freq: Three times a day (TID) | ORAL | Status: DC
  Administered 2018-11-22 – 2018-11-23 (×4): 50 mg via ORAL
  Filled 2018-11-22 (×4): qty 1

## 2018-11-22 MED ORDER — UMECLIDINIUM BROMIDE 62.5 MCG/INH IN AEPB: 1.0000 | INHALATION_SPRAY | Freq: Every day | RESPIRATORY_TRACT | Status: DC

## 2018-11-22 MED ORDER — SERTRALINE HCL 100 MG PO TABS
100.0000 mg | ORAL_TABLET | Freq: Every day | ORAL | Status: DC
  Administered 2018-11-22 – 2018-11-23 (×2): 100 mg via ORAL
  Filled 2018-11-22 (×2): qty 1

## 2018-11-22 MED ORDER — SODIUM CHLORIDE 0.9% FLUSH
3.0000 mL | Freq: Two times a day (BID) | INTRAVENOUS | Status: DC
  Administered 2018-11-22 – 2018-11-23 (×4): 3 mL via INTRAVENOUS

## 2018-11-22 MED ORDER — SODIUM CHLORIDE 0.9 % IV SOLN
500.0000 mg | Freq: Every day | INTRAVENOUS | Status: DC
  Administered 2018-11-22 – 2018-11-23 (×2): 500 mg via INTRAVENOUS
  Filled 2018-11-22 (×2): qty 500

## 2018-11-22 MED ORDER — METHYLPREDNISOLONE SODIUM SUCC 125 MG IJ SOLR
80.0000 mg | Freq: Three times a day (TID) | INTRAMUSCULAR | Status: DC
  Administered 2018-11-22 – 2018-11-23 (×5): 80 mg via INTRAVENOUS
  Filled 2018-11-22 (×5): qty 2

## 2018-11-22 MED ORDER — TAMSULOSIN HCL 0.4 MG PO CAPS
0.4000 mg | ORAL_CAPSULE | Freq: Every day | ORAL | Status: DC
  Administered 2018-11-22 – 2018-11-23 (×2): 0.4 mg via ORAL
  Filled 2018-11-22 (×2): qty 1

## 2018-11-22 MED ORDER — SODIUM CHLORIDE 0.9 % IV SOLN
250.0000 mL | INTRAVENOUS | Status: DC | PRN
  Administered 2018-11-22: 250 mL via INTRAVENOUS

## 2018-11-22 MED ORDER — ACETAMINOPHEN 325 MG PO TABS: 650.0000 mg | ORAL_TABLET | Freq: Four times a day (QID) | ORAL | Status: DC | PRN

## 2018-11-22 MED ORDER — MIRABEGRON ER 50 MG PO TB24
50.0000 mg | ORAL_TABLET | Freq: Every day | ORAL | Status: DC
  Administered 2018-11-22 – 2018-11-23 (×2): 50 mg via ORAL
  Filled 2018-11-22 (×2): qty 1

## 2018-11-22 MED ORDER — METOPROLOL TARTRATE 50 MG PO TABS
50.0000 mg | ORAL_TABLET | Freq: Two times a day (BID) | ORAL | Status: DC
  Administered 2018-11-22 – 2018-11-23 (×3): 50 mg via ORAL
  Filled 2018-11-22 (×3): qty 1

## 2018-11-22 MED ORDER — VITAMIN B-12 1000 MCG PO TABS
1000.0000 ug | ORAL_TABLET | ORAL | Status: DC
  Administered 2018-11-22: 1000 ug via ORAL
  Filled 2018-11-22: qty 1

## 2018-11-22 MED ORDER — ZOLPIDEM TARTRATE 5 MG PO TABS: 5.0000 mg | ORAL_TABLET | Freq: Every evening | ORAL | Status: DC | PRN

## 2018-11-22 MED ORDER — PANTOPRAZOLE SODIUM 40 MG PO TBEC
40.0000 mg | DELAYED_RELEASE_TABLET | Freq: Every day | ORAL | Status: DC
  Administered 2018-11-22 – 2018-11-23 (×2): 40 mg via ORAL
  Filled 2018-11-22 (×2): qty 1

## 2018-11-22 MED ORDER — APIXABAN 2.5 MG PO TABS
2.5000 mg | ORAL_TABLET | Freq: Two times a day (BID) | ORAL | Status: DC
  Administered 2018-11-22 – 2018-11-23 (×4): 2.5 mg via ORAL
  Filled 2018-11-22 (×4): qty 1

## 2018-11-22 MED ORDER — BRIMONIDINE TARTRATE 0.2 % OP SOLN
1.0000 [drp] | Freq: Two times a day (BID) | OPHTHALMIC | Status: DC
  Administered 2018-11-22 – 2018-11-23 (×3): 1 [drp] via OPHTHALMIC
  Filled 2018-11-22: qty 5

## 2018-11-22 MED ORDER — LORATADINE 10 MG PO TABS
10.0000 mg | ORAL_TABLET | Freq: Every day | ORAL | Status: DC
  Administered 2018-11-22 – 2018-11-23 (×2): 10 mg via ORAL
  Filled 2018-11-22 (×2): qty 1

## 2018-11-22 MED ORDER — POLYETHYLENE GLYCOL 3350 17 G PO PACK
17.0000 g | PACK | ORAL | Status: DC
  Administered 2018-11-23: 17 g via ORAL
  Filled 2018-11-22: qty 1

## 2018-11-22 MED ORDER — MONTELUKAST SODIUM 10 MG PO TABS
10.0000 mg | ORAL_TABLET | Freq: Every day | ORAL | Status: DC
  Administered 2018-11-22 – 2018-11-23 (×2): 10 mg via ORAL
  Filled 2018-11-22 (×2): qty 1

## 2018-11-22 MED ORDER — POTASSIUM CHLORIDE CRYS ER 20 MEQ PO TBCR
40.0000 meq | EXTENDED_RELEASE_TABLET | Freq: Every day | ORAL | Status: DC
  Administered 2018-11-22 – 2018-11-23 (×2): 40 meq via ORAL
  Filled 2018-11-22 (×2): qty 2

## 2018-11-22 MED ORDER — MAGNESIUM OXIDE 400 (241.3 MG) MG PO TABS
400.0000 mg | ORAL_TABLET | Freq: Every day | ORAL | Status: DC
  Administered 2018-11-22 – 2018-11-23 (×2): 400 mg via ORAL
  Filled 2018-11-22 (×2): qty 1

## 2018-11-22 MED ORDER — INSULIN DETEMIR 100 UNIT/ML ~~LOC~~ SOLN
35.0000 [IU] | Freq: Every day | SUBCUTANEOUS | Status: DC
  Administered 2018-11-22 – 2018-11-23 (×2): 35 [IU] via SUBCUTANEOUS
  Filled 2018-11-22 (×3): qty 0.35

## 2018-11-22 MED ORDER — TRAMADOL HCL 50 MG PO TABS: 25.0000 mg | ORAL_TABLET | Freq: Three times a day (TID) | ORAL | Status: DC | PRN

## 2018-11-22 MED ORDER — UMECLIDINIUM-VILANTEROL 62.5-25 MCG/INH IN AEPB
1.0000 | INHALATION_SPRAY | Freq: Every day | RESPIRATORY_TRACT | Status: DC
  Administered 2018-11-22 – 2018-11-23 (×2): 1 via RESPIRATORY_TRACT
  Filled 2018-11-22: qty 14

## 2018-11-22 MED ORDER — FERROUS SULFATE 325 (65 FE) MG PO TABS
325.0000 mg | ORAL_TABLET | Freq: Two times a day (BID) | ORAL | Status: DC
  Administered 2018-11-22 – 2018-11-23 (×3): 325 mg via ORAL
  Filled 2018-11-22 (×3): qty 1

## 2018-11-22 NOTE — TOC Initial Note (Signed)
Transition of Care [facility]) - Initial/Assessment Note    Patient Details  Name: Jocelyn Schultz MRN: [id number] Date of Birth: 1931-03-09  [facility]) CM/SW Contact:    Candie Chroman, LCSW Phone Number: 11/22/2018, 3:25 PM  Clinical Narrative: CSW met with patient, introduced role, and explained that discharge planning would be discussed. Patient confirmed she is a long-term resident at [facility] and plans to return at discharge. Per admissions coordinator, she will not have to go to the [place] facility at discharge to quarantine for 14 days prior to return.            Expected Discharge Plan: Skilled Nursing Facility Barriers to Discharge: Continued Medical Work up   Patient Goals and CMS Choice   CMS Medicare.gov Compare Post Acute Care list provided to:: Other (Comment Required)(Patient is long-term at [facility].)    Expected Discharge Plan and Services Expected Discharge Plan: [place] Choice: [place] arrangements for the past 2 months: [place]                                      Prior Living Arrangements/Services Living arrangements for the past 2 months: [place] Lives with:: Facility Resident Patient language and need for interpreter reviewed:: No Do you feel safe going back to the place where you live?: Yes      Need for Family Participation in Patient Care: Yes (Comment) Care giver support system in place?: Yes (comment)(SNF staff)   Criminal Activity/Legal Involvement Pertinent to Current Situation/Hospitalization: No - Comment as needed  Activities of Daily Living Home Assistive Devices/Equipment: Gilford Rile (specify type) ADL Screening (condition at time of admission) Patient's cognitive ability adequate to safely complete daily activities?: Yes Is the patient deaf or have difficulty hearing?: No Does the patient have  difficulty seeing, even when wearing glasses/contacts?: No Does the patient have difficulty concentrating, remembering, or making decisions?: No Patient able to express need for assistance with ADLs?: Yes Does the patient have difficulty dressing or bathing?: No Independently performs ADLs?: Yes (appropriate for developmental age) Does the patient have difficulty walking or climbing stairs?: No Weakness of Legs: Both Weakness of Arms/Hands: None  Permission Sought/Granted Permission sought to share information with : Facility Art therapist granted to share information with : Yes, Verbal Permission Granted     Permission granted to share info w AGENCY: [facility]        Emotional Assessment Appearance:: Appears stated age Attitude/Demeanor/Rapport: Engaged, Gracious Affect (typically observed): Accepting, Appropriate, Calm, Pleasant Orientation: : Oriented to Self, Oriented to Place, Oriented to  Time, Oriented to Situation Alcohol / Substance Use: Never Used Psych Involvement: No (comment)  Admission diagnosis:  AKI (acute kidney injury) ([place]) [N17.9] Upper respiratory tract infection, unspecified type [J06.9] Patient Active Problem List   Diagnosis Date Noted  . Dyspnea 11/22/2018  . ARF (acute renal failure) ([place]) 11/22/2018  . Protein-calorie malnutrition, severe ([place]) 11/22/2018  . Cardiomyopathy ([place]) 12/30/2015  . Syncope 04/02/2015  . Diabetes ([place]) 04/01/2015  . CHF (congestive heart failure) ([place]) 04/01/2015  . HTN (hypertension) 04/01/2015  . Hypomagnesemia 04/01/2015  . Hypokalemia 04/01/2015  . Syncope and collapse   . Multifocal atrial tachycardia (HCC)   . Congestive heart disease ([place])    PCP:  Josetta Huddle, MD Pharmacy:  No  Pharmacies Listed    Social Determinants of Health (SDOH) Interventions    Readmission Risk Interventions No flowsheet data found.

## 2018-11-22 NOTE — Progress Notes (Signed)
Patient was seen and examined.  She denied any complaints.  Admitted early morning hours by nighttime hospitalist.  See H&P. COVID-19 negative. Echocardiogram pending. Recently treated with doxycycline for pneumonia.  CT scan shows persistent peribronchial thickening probably responsible for increasing shortness of breath.  proBNP was elevated, will wait for echocardiogram results to diurese. CT scan chest also shows right supraclavicular lymphadenopathy, significance unknown.  After treatment of infection she may need follow-up. Patient will need inpatient treatment management, IV antibiotics and bronchodilator therapy and monitoring before discharge.  Will change to inpatient.

## 2018-11-22 NOTE — [place] FL2 (Signed)
[place] MEDICAID FL2 LEVEL OF CARE SCREENING TOOL     IDENTIFICATION  Patient Name: Jocelyn Schultz Birthdate: 1931/04/18 Sex: female Admission Date (Current Location): 11/21/2018  [facility] and [place] Number:  Producer, television/film/video and Address:  [facility], [address], [place], [place] [address]      Provider Number: [id number]  Attending Physician Name and Address:  Dorcas Carrow, MD  Relative Name and Phone Number:       Current Level of Care: Hospital Recommended Level of Care: Skilled Nursing Facility Prior Approval Number:    Date Approved/Denied:   PASRR Number: [id number]  Discharge Plan: SNF    Current Diagnoses: Patient Active Problem List   Diagnosis Date Noted  . Dyspnea 11/22/2018  . ARF (acute renal failure) (HCC) 11/22/2018  . Protein-calorie malnutrition, severe (HCC) 11/22/2018  . Cardiomyopathy (HCC) 12/30/2015  . Syncope 04/02/2015  . Diabetes (HCC) 04/01/2015  . CHF (congestive heart failure) (HCC) 04/01/2015  . HTN (hypertension) 04/01/2015  . Hypomagnesemia 04/01/2015  . Hypokalemia 04/01/2015  . Syncope and collapse   . Multifocal atrial tachycardia (HCC)   . Congestive heart disease (HCC)     Orientation RESPIRATION BLADDER Height & Weight     Self, Time, Situation, Place  O2(Nasal Canula 2 L) Continent Weight: 165 lb (74.8 kg) Height:   (157.5 cm)  BEHAVIORAL SYMPTOMS/MOOD NEUROLOGICAL BOWEL NUTRITION STATUS  (None) (None) Continent Diet(Heart healthy/carb modified)  AMBULATORY STATUS COMMUNICATION OF NEEDS Skin     Verbally Other (Comment)(Rash.)                       Personal Care Assistance Level of Assistance              Functional Limitations Info  Sight, Hearing, Speech Sight Info: Adequate Hearing Info: Adequate Speech Info: Adequate    SPECIAL CARE FACTORS FREQUENCY                       Contractures Contractures Info: Not present    Additional Factors Info   Code Status, Allergies Code Status Info: DNR Allergies Info: Aspirin, Saccharin, Soap           Current Medications (11/22/2018):  This is the current hospital active medication list Current Facility-Administered Medications  Medication Dose Route Frequency Provider Last Rate Last Dose  . 0.9 %  sodium chloride infusion  250 mL Intravenous PRN Pearson Grippe, MD 10 mL/hr at 11/22/18 0229 250 mL at 11/22/18 0229  . acetaminophen (TYLENOL) tablet 650 mg  650 mg Oral Q6H PRN Pearson Grippe, MD       Or  . acetaminophen (TYLENOL) suppository 650 mg  650 mg Rectal Q6H PRN Pearson Grippe, MD      . albuterol (PROVENTIL) (2.5 MG/3ML) 0.083% nebulizer solution 2.5 mg  2.5 mg Nebulization Q6H PRN Pearson Grippe, MD      . albuterol (PROVENTIL) (2.5 MG/3ML) 0.083% nebulizer solution 2.5 mg  2.5 mg Nebulization TID Pearson Grippe, MD   2.5 mg at 11/22/18 1458  . apixaban (ELIQUIS) tablet 2.5 mg  2.5 mg Oral BID Titus Mould, RPH   2.5 mg at 11/22/18 1113  . azithromycin (ZITHROMAX) 500 mg in sodium chloride 0.9 % 250 mL IVPB  500 mg Intravenous Q0600 Pearson Grippe, MD 250 mL/hr at 11/22/18 0230 500 mg at 11/22/18 0230  . brimonidine (ALPHAGAN) 0.2 % ophthalmic solution 1 drop  1 drop Both Eyes BID  Pearson GrippeKim, James, MD   1 drop at 11/22/18 1100  . feeding supplement (PRO-STAT SUGAR FREE 64) liquid 30 mL  30 mL Oral BID Pearson GrippeKim, James, MD   30 mL at 11/22/18 1057  . ferrous sulfate tablet 325 mg  325 mg Oral BID WC Pearson GrippeKim, James, MD   325 mg at 11/22/18 1057  . hydrALAZINE (APRESOLINE) tablet 50 mg  50 mg Oral TID Pearson GrippeKim, James, MD   50 mg at 11/22/18 1058  . insulin detemir (LEVEMIR) injection 35 Units  35 Units Subcutaneous Daily Pearson GrippeKim, James, MD   35 Units at 11/22/18 1056  . loratadine (CLARITIN) tablet 10 mg  10 mg Oral Daily Pearson GrippeKim, James, MD   10 mg at 11/22/18 1057  . magnesium oxide (MAG-OX) tablet 400 mg  400 mg Oral Daily Pearson GrippeKim, James, MD   400 mg at 11/22/18 1057  . methylPREDNISolone sodium succinate (SOLU-MEDROL) 125 mg/2 mL  injection 80 mg  80 mg Intravenous Elza RafterQ8H Kim, James, MD   80 mg at 11/22/18 1058  . metoprolol tartrate (LOPRESSOR) tablet 50 mg  50 mg Oral BID Pearson GrippeKim, James, MD   50 mg at 11/22/18 1058  . mirabegron ER (MYRBETRIQ) tablet 50 mg  50 mg Oral Daily Pearson GrippeKim, James, MD   50 mg at 11/22/18 1104  . montelukast (SINGULAIR) tablet 10 mg  10 mg Oral Daily Pearson GrippeKim, James, MD   10 mg at 11/22/18 1057  . pantoprazole (PROTONIX) EC tablet 40 mg  40 mg Oral Daily Pearson GrippeKim, James, MD   40 mg at 11/22/18 1058  . [START ON 11/23/2018] polyethylene glycol (MIRALAX / GLYCOLAX) packet 17 g  17 g Oral Adan SisQODAY Kim, James, MD      . polyvinyl alcohol (LIQUIFILM TEARS) 1.4 % ophthalmic solution 1 drop  1 drop Both Eyes QID Pearson GrippeKim, James, MD   1 drop at 11/22/18 1311  . potassium chloride SA (K-DUR) CR tablet 40 mEq  40 mEq Oral Daily Pearson GrippeKim, James, MD   40 mEq at 11/22/18 1057  . prednisoLONE acetate (PRED FORTE) 1 % ophthalmic suspension 1 drop  1 drop Right Eye BID Pearson GrippeKim, James, MD   1 drop at 11/22/18 1106  . sertraline (ZOLOFT) tablet 100 mg  100 mg Oral Daily Pearson GrippeKim, James, MD   100 mg at 11/22/18 1057  . sodium chloride flush (NS) 0.9 % injection 3 mL  3 mL Intravenous Q12H Pearson GrippeKim, James, MD   3 mL at 11/22/18 1105  . sodium chloride flush (NS) 0.9 % injection 3 mL  3 mL Intravenous PRN Pearson GrippeKim, James, MD      . tamsulosin [facility](FLOMAX) capsule 0.4 mg  0.4 mg Oral Daily Pearson GrippeKim, James, MD   0.4 mg at 11/22/18 1058  . torsemide (DEMADEX) tablet 60 mg  60 mg Oral Daily Pearson GrippeKim, James, MD   60 mg at 11/22/18 1057  . traMADol (ULTRAM) tablet 25 mg  25 mg Oral Q8H PRN Pearson GrippeKim, James, MD      . umeclidinium-vilanterol [facility](ANORO ELLIPTA) 62.5-25 MCG/INH 1 puff  1 puff Inhalation Daily Pearson GrippeKim, James, MD   1 puff at 11/22/18 [phone]  . vitamin B-12 (CYANOCOBALAMIN) tablet 1,000 mcg  1,000 mcg Oral Q3 days Pearson GrippeKim, James, MD   1,000 mcg at 11/22/18 1351  . zolpidem (AMBIEN) tablet 5 mg  5 mg Oral QHS PRN Pearson GrippeKim, James, MD         Discharge Medications: Please see discharge summary for a list  of discharge medications.  Relevant Imaging Results:  Relevant  Lab Results:   Additional Information SS#: [id number]. COVID Negative.  Margarito Liner, LCSW

## 2018-11-22 NOTE — Progress Notes (Signed)
  Echocardiogram 2D Echocardiogram has been performed.  A limited echocardiogram was performed in accordance with the Director's protocol to reduce the patient to technician exposure during COVID 19.  Belva Chimes 11/22/2018, 9:58 AM

## 2018-11-22 NOTE — ED Notes (Signed)
ED TO INPATIENT HANDOFF REPORT  ED Nurse Name and Phone #:  Thurmond Butts [phone]   Code Status: Prior  Home/SNF/Other Home Patient oriented to: self, place, time and situation Is this baseline? Yes   Triage Complete: Triage complete  Chief Complaint SOB  Triage Note Pt here from [facility] for exertional SOB.  Pt via [facility] with Nasal O2, SPO2 100% on 4 LPM.   Allergies Allergies  Allergen Reactions  . Aspirin Other (See Comments)    Other Reaction: mouth blisters  . Saccharin Other (See Comments)    Other Reaction: oral blisters with art. sweetn  . Soap Other (See Comments)    Other Reaction: dial-drys skin    Level of Care/Admitting Diagnosis ED Disposition    ED Disposition Condition Comment   Admit  Hospital Area: [facility] [[id number]]  Level of Care: Telemetry Cardiac [103]  I expect the patient will be discharged within 24 hours: No (not a candidate for 5C-Observation unit)  Covid Evaluation: N/A  Diagnosis: Dyspnea [454098]  Admitting Physician: Pearson Grippe [[id number]]  Attending Physician: Pearson Grippe [[id number]]  PT Class (Do Not Modify): Observation [104]  PT Acc Code (Do Not Modify): Observation [10022]       B Medical/Surgery History Past Medical History:  Diagnosis Date  . Acute bronchitis    hx of   . Age-related physical debility   . Allergic rhinitis   . Anemia    iron deficiency per H7P from [facility]  . Asthma    per H&P from [facility]  . Cataract   . CHF (congestive heart failure) (HCC)    acute systolic (congestive)heart failure per H&P from [facility]  . Complication of anesthesia    per sister, states daughter said she is hard to wake up  . Constipation   . COPD (chronic obstructive pulmonary disease) (HCC)    per H&P from [facility]  . Dementia (HCC)    from progress note from [facility]  . Depression    per H&P from [facility]  . DM2 (diabetes mellitus, type 2)  (HCC)   . Dry beriberi   . Dry eye syndrome   . Dyspnea    per H&P from [facility]  . Dysrhythmia    h/o SVT  and atrial flutter from H&P from [facility]  . GERD (gastroesophageal reflux disease)    per H&P from [facility]  . Glaucoma   . Headache    from progress note from [facility]  . HTN (hypertension)    per H&P from [facility]  . Hyperlipidemia   . Hypokalemia    hx of   . Insomnia   . Multifocal atrial tachycardia (HCC)   . Osteoporosis   . Overactive bladder   . Vitamin B deficiency    Past Surgical History:  Procedure Laterality Date  . ANKLE SURGERY Left   . ARTERY BIOPSY Right 04/29/2017   Procedure: RIGHT TEMPORAL ARTERY BIOPSY;  Surgeon: [name], MD;  Location: [facility];  Service: General;  Laterality: Right;  . DILATION AND CURETTAGE OF UTERUS    . DILATION AND CURETTAGE, DIAGNOSTIC / THERAPEUTIC    . EYE SURGERY  2011, 2012  . FOOT SURGERY Left   . HAND SURGERY Left   . KNEE SURGERY Left 2011     A IV Location/Drains/Wounds Patient Lines/Drains/Airways Status   Active Line/Drains/Airways    Name:  Placement date:   Placement time:   Site:   Days:   Peripheral IV 11/21/18 Left Antecubital   11/21/18    2312    Antecubital   1   Incision (Closed) 04/29/17 Face Right   04/29/17    1129     572          Intake/Output Last 24 hours  Intake/Output Summary (Last 24 hours) at 11/22/2018 0103 Last data filed at 11/22/2018 0047 Gross per 24 hour  Intake 500 ml  Output -  Net 500 ml    Labs/Imaging Results for orders placed or performed during the hospital encounter of 11/21/18 (from the past 48 hour(s))  SARS Coronavirus 2 [facility](Hospital order, Performed in [facility] hospital lab)     Status: None   Collection Time: 11/21/18  9:41 PM  Result Value Ref Range   SARS Coronavirus 2 NEGATIVE NEGATIVE    Comment: (NOTE) If result is NEGATIVE SARS-CoV-2 target nucleic acids are NOT DETECTED. The  SARS-CoV-2 RNA is generally detectable in upper and lower  respiratory specimens during the acute phase of infection. The lowest  concentration of SARS-CoV-2 viral copies this assay can detect is 250  copies / mL. A negative result does not preclude SARS-CoV-2 infection  and should not be used as the sole basis for treatment or other  patient management decisions.  A negative result may occur with  improper specimen collection / handling, submission of specimen other  than nasopharyngeal swab, presence of viral mutation(s) within the  areas targeted by this assay, and inadequate number of viral copies  (<250 copies / mL). A negative result must be combined with clinical  observations, patient history, and epidemiological information. If result is POSITIVE SARS-CoV-2 target nucleic acids are DETECTED. The SARS-CoV-2 RNA is generally detectable in upper and lower  respiratory specimens dur ing the acute phase of infection.  Positive  results are indicative of active infection with SARS-CoV-2.  Clinical  correlation with patient history and other diagnostic information is  necessary to determine patient infection status.  Positive results do  not rule out bacterial infection or co-infection with other viruses. If result is PRESUMPTIVE POSTIVE SARS-CoV-2 nucleic acids MAY BE PRESENT.   A presumptive positive result was obtained on the submitted specimen  and confirmed on repeat testing.  While 2019 novel coronavirus  (SARS-CoV-2) nucleic acids may be present in the submitted sample  additional confirmatory testing may be necessary for epidemiological  and / or clinical management purposes  to differentiate between  SARS-CoV-2 and other Sarbecovirus currently known to infect humans.  If clinically indicated additional testing with an alternate test  methodology [phone] is advised. The SARS-CoV-2 RNA is generally  detectable in upper and lower respiratory sp ecimens during the acute   phase of infection. The expected result is Negative. Fact Sheet for Patients:  BoilerBrush.com.cyhttps://www.fda.gov/media/136312/download Fact Sheet for Healthcare Providers: https://pope.com/https://www.fda.gov/media/136313/download This test is not yet approved or cleared by the [place] FDA and has been authorized for detection and/or diagnosis of SARS-CoV-2 by FDA under an Emergency Use Authorization (EUA).  This EUA will remain in effect (meaning this test can be used) for the duration of the COVID-19 declaration under Section 564(b)(1) of the Act, 21 U.S.C. section 360bbb-3(b)(1), unless the authorization is terminated or revoked sooner. Performed at [facility], [address] [place], [place] [id number]   Lactic acid, plasma     Status: None   Collection Time: 11/21/18  9:41 PM  Result  Value Ref Range   Lactic Acid, Venous 1.4 0.5 - 1.9 mmol/L    Comment: Performed at [facility], [address] [place], [place] [address]  CBC WITH DIFFERENTIAL     Status: Abnormal   Collection Time: 11/21/18  9:41 PM  Result Value Ref Range   WBC 10.6 (H) 4.0 - 10.5 K/uL   RBC 3.48 (L) 3.87 - 5.11 MIL/uL   Hemoglobin 9.4 (L) 12.0 - 15.0 g/dL   HCT 60.4 (L) 54.0 - 98.1 %   MCV 89.9 80.0 - 100.0 fL   MCH 27.0 26.0 - 34.0 pg   MCHC 30.0 30.0 - 36.0 g/dL   RDW 19.1 (H) 47.8 - 29.5 %   Platelets 217 150 - 400 K/uL   nRBC 0.0 0.0 - 0.2 %   Neutrophils Relative % 74 %   Neutro Abs 7.9 (H) 1.7 - 7.7 K/uL   Lymphocytes Relative 12 %   Lymphs Abs 1.3 0.7 - 4.0 K/uL   Monocytes Relative 7 %   Monocytes Absolute 0.8 0.1 - 1.0 K/uL   Eosinophils Relative 5 %   Eosinophils Absolute 0.5 0.0 - 0.5 K/uL   Basophils Relative 1 %   Basophils Absolute 0.1 0.0 - 0.1 K/uL   Immature Granulocytes 1 %   Abs Immature Granulocytes 0.06 0.00 - 0.07 K/uL    Comment: Performed at [facility], [address], [place], [place] [address]  Comprehensive metabolic panel     Status: Abnormal   Collection Time:  11/21/18  9:41 PM  Result Value Ref Range   Sodium 142 135 - 145 mmol/L   Potassium 4.1 3.5 - 5.1 mmol/L   Chloride 103 98 - 111 mmol/L   CO2 27 22 - 32 mmol/L   Glucose, Bld 88 70 - 99 mg/dL   BUN 83 (H) 8 - 23 mg/dL   Creatinine, Ser 8.65 (H) 0.44 - 1.00 mg/dL   Calcium 9.2 8.9 - 78.4 mg/dL   Total Protein 8.2 (H) 6.5 - 8.1 g/dL   Albumin 2.7 (L) 3.5 - 5.0 g/dL   AST 62 (H) 15 - 41 U/L   ALT 35 0 - 44 U/L   Alkaline Phosphatase 95 38 - 126 U/L   Total Bilirubin 0.4 0.3 - 1.2 mg/dL   GFR calc non Af Amer 21 (L) >60 mL/min   GFR calc Af Amer 24 (L) >60 mL/min   Anion gap 12 5 - 15    Comment: Performed at [facility], [address], [place], [place] [address]  D-dimer, quantitative     Status: Abnormal   Collection Time: 11/21/18  9:41 PM  Result Value Ref Range   D-Dimer, Quant 0.70 (H) 0.00 - 0.50 ug/mL-FEU    Comment: (NOTE) At the manufacturer cut-off of 0.50 ug/mL FEU, this assay has been documented to exclude PE with a sensitivity and negative predictive value of 97 to 99%.  At this time, this assay has not been approved by the FDA to exclude DVT/VTE. Results should be correlated with clinical presentation. Performed at [facility], [address], [place], [place] [address]   Procalcitonin     Status: None   Collection Time: 11/21/18  9:41 PM  Result Value Ref Range   Procalcitonin <0.10 ng/mL    Comment:        Interpretation: PCT (Procalcitonin) <= 0.5 ng/mL: Systemic infection (sepsis) is not likely. Local bacterial infection is possible. (NOTE)  Sepsis PCT Algorithm           Lower Respiratory Tract                                      Infection PCT Algorithm    ----------------------------     ----------------------------         PCT < 0.25 ng/mL                PCT < 0.10 ng/mL         Strongly encourage             Strongly discourage   discontinuation of antibiotics    initiation of antibiotics    ----------------------------      -----------------------------       PCT 0.25 - 0.50 ng/mL            PCT 0.10 - 0.25 ng/mL               OR       >80% decrease in PCT            Discourage initiation of                                            antibiotics      Encourage discontinuation           of antibiotics    ----------------------------     -----------------------------         PCT >= 0.50 ng/mL              PCT 0.26 - 0.50 ng/mL               AND        <80% decrease in PCT             Encourage initiation of                                             antibiotics       Encourage continuation           of antibiotics    ----------------------------     -----------------------------        PCT >= 0.50 ng/mL                  PCT > 0.50 ng/mL               AND         increase in PCT                  Strongly encourage                                      initiation of antibiotics    Strongly encourage escalation           of antibiotics                                     -----------------------------  PCT <= 0.25 ng/mL                                                 OR                                        > 80% decrease in PCT                                     Discontinue / Do not initiate                                             antibiotics Performed at [facility], [address], [place], [place] [address]   Lactate dehydrogenase     Status: Abnormal   Collection Time: 11/21/18  9:41 PM  Result Value Ref Range   LDH 252 (H) 98 - 192 U/L    Comment: Performed at [facility], [address] [place], [place] [address]  Ferritin     Status: None   Collection Time: 11/21/18  9:41 PM  Result Value Ref Range   Ferritin 58 11 - 307 ng/mL    Comment: Performed at [facility], [address] [place], [place] [address]  Triglycerides     Status: None   Collection Time: 11/21/18  9:41 PM  Result Value Ref Range   Triglycerides 40 <150  mg/dL    Comment: Performed at [facility], [address], [place], [place] [address]  Fibrinogen     Status: Abnormal   Collection Time: 11/21/18  9:41 PM  Result Value Ref Range   Fibrinogen 557 (H) 210 - 475 mg/dL    Comment: Performed at [facility], [address], [place], [place] [address]  C-reactive protein     Status: Abnormal   Collection Time: 11/21/18  9:41 PM  Result Value Ref Range   CRP 9.8 (H) <1.0 mg/dL    Comment: Performed at [facility], [address], [place], [place] [address]  Brain natriuretic peptide     Status: Abnormal   Collection Time: 11/21/18  9:41 PM  Result Value Ref Range   B Natriuretic Peptide 479.7 (H) 0.0 - 100.0 pg/mL    Comment: Performed at [facility], [address], [place], [place] [address]   Dg Chest Port 1 View  Result Date: 11/21/2018 CLINICAL DATA:  Shortness of breath, cough, history COPD, CHF, multifocal atrial tachycardia, hypertension, diabetes mellitus, asthma, former smoker EXAM: PORTABLE CHEST 1 VIEW COMPARISON:  Portable exam 2031 hours compared to 03/07/2018 FINDINGS: Borderline enlargement of cardiac silhouette. Mediastinal contours and pulmonary vascularity normal. Minimal chronic interstitial prominence. Mild bibasilar subsegmental atelectasis. Chronic central peribronchial thickening, slightly decreased. No definite acute infiltrate, pleural effusion or pneumothorax. Bones demineralized. IMPRESSION: Bibasilar atelectasis without definite acute infiltrate. Electronically Signed   By: Ulyses Southward M.D.   On: 11/21/2018 20:41    Pending Labs [employer] (From admission, onward)    Start     Ordered  11/23/18 0500  Troponin I - Now Then Q6H  Now then every 6 hours,   R     11/22/18 0010   11/22/18 0010  CK total and CKMB (cardiac)not at [facility]  Once,   R     11/22/18 0010   11/22/18 0009  Troponin I - ONCE - STAT  ONCE - STAT,   R     11/22/18 0010   11/21/18 2012  Lactic acid, plasma  Now then  every 2 hours,   STAT     11/21/18 2012   11/21/18 2012  Blood Culture (routine x 2)  BLOOD CULTURE X 2,   STAT     11/21/18 2012          Vitals/Pain Today's Vitals   11/21/18 1954 11/21/18 1955 11/21/18 1957  BP:   (!) 145/125  Pulse:   60  Resp:   (!) 25  Temp:   98.6 F (37 C)  TempSrc:   Oral  SpO2: 100%  97%  Weight:  74.8 kg   Height:   (1.575 m)   PainSc: 0-No pain      Isolation Precautions No active isolations  Medications Medications  0.9 %  sodium chloride infusion (1,000 mLs Intravenous Not Given 11/21/18 2344)  sodium chloride 0.9 % bolus 500 mL (0 mLs Intravenous Stopped 11/22/18 0047)    Followed by  0.9 %  sodium chloride infusion (1,000 mLs Intravenous New Bag/Given 11/22/18 0050)    Mobility walks Low fall risk   Focused Assessments Pulmonary Assessment Handoff:  Lung sounds: L Breath Sounds: Diminished, Fine crackles R Breath Sounds: Diminished, Fine crackles O2 Device: Nasal Cannula O2 Flow Rate (L/min): 1 L/min      R Recommendations: See Admitting Provider Note  Report given to:   Additional Notes:

## 2018-11-22 NOTE — Progress Notes (Addendum)
ANTICOAGULATION CONSULT NOTE - Initial Consult  Pharmacy Consult for Apixaban Indication: atrial fibrillation  Allergies  Allergen Reactions  . Aspirin Other (See Comments)    Other Reaction: mouth blisters  . Saccharin Other (See Comments)    Other Reaction: oral blisters with art. sweetn  . Soap Other (See Comments)    Other Reaction: dial-drys skin    Patient Measurements: Height:  (157.5 cm) Weight: 165 lb (74.8 kg) IBW/kg (Calculated) : 50.1  Vital Signs: Temp: 98.4 F (36.9 C) (04/29 0136) Temp Source: Oral (04/29 0136) BP: 143/67 (04/29 0136) Pulse Rate: 71 (04/29 0136)  Labs: Recent Labs    11/21/18 2141  HGB 9.4*  HCT 31.3*  PLT 217  CREATININE 2.12*    Estimated Creatinine Clearance: 18.4 mL/min (A) (by C-G formula based on SCr of 2.12 mg/dL (H)).   Medical History: Past Medical History:  Diagnosis Date  . Acute bronchitis    hx of   . Age-related physical debility   . Allergic rhinitis   . Anemia    iron deficiency per H7P from [facility]  . Asthma    per H&P from [facility]  . Cataract   . CHF (congestive heart failure) (HCC)    acute systolic (congestive)heart failure per H&P from [facility]  . Complication of anesthesia    per sister, states daughter said she is hard to wake up  . Constipation   . COPD (chronic obstructive pulmonary disease) (HCC)    per H&P from [facility]  . Dementia (HCC)    from progress note from [facility]  . Depression    per H&P from [facility]  . DM2 (diabetes mellitus, type 2) (HCC)   . Dry beriberi   . Dry eye syndrome   . Dyspnea    per H&P from [facility]  . Dysrhythmia    h/o SVT  and atrial flutter from H&P from [facility]  . GERD (gastroesophageal reflux disease)    per H&P from [facility]  . Glaucoma   . Headache    from progress note from [facility]  . HTN (hypertension)    per H&P from  [facility]  . Hyperlipidemia   . Hypokalemia    hx of   . Insomnia   . Multifocal atrial tachycardia (HCC)   . Osteoporosis   . Overactive bladder   . Vitamin B deficiency     Medications:  Medications Prior to Admission  Medication Sig Dispense Refill Last Dose  . albuterol (VENTOLIN HFA) 108 (90 Base) MCG/ACT inhaler Inhale 2 puffs into the lungs every 4 (four) hours as needed for wheezing or shortness of breath.   unk  . amoxicillin-clavulanate (AUGMENTIN) 500-125 MG tablet Take 1 tablet by mouth 3 (three) times daily. For 7 days   11/21/2018 at Unknown time  . brimonidine (ALPHAGAN) 0.2 % ophthalmic solution Place 1 drop into both eyes 2 (two) times daily.    11/21/2018 at Unknown time  . Carboxymethylcellul-Glycerin (REFRESH OPTIVE OP) Place 1 drop into both eyes 4 (four) times daily.    11/21/2018 at Unknown time  . doxycycline (VIBRA-TABS) 100 MG tablet Take 100 mg by mouth 2 (two) times daily. For 7 days   11/21/2018 at Unknown time  . ELIQUIS 2.5 MG TABS tablet Take 1 tablet by mouth 2 (two) times daily.    11/21/2018 at 0900  . ferrous sulfate 325 (65 FE)  MG tablet Take 325 mg by mouth 2 (two) times a day.    11/21/2018 at Unknown time  . fluticasone furoate-vilanterol (BREO ELLIPTA) 100-25 MCG/INH AEPB Inhale 1 puff into the lungs daily.   11/21/2018 at Unknown time  . guaiFENesin-dextromethorphan (ROBITUSSIN DM) 100-10 MG/5ML syrup Take 10 mLs by mouth every 4 (four) hours as needed for cough.   11/16/2018  . hydrALAZINE (APRESOLINE) 50 MG tablet Take 50 mg by mouth 3 (three) times daily.    11/21/2018 at Unknown time  . Insulin Detemir (LEVEMIR FLEXTOUCH [place]) Inject 35 Units into the skin every morning.   11/21/2018 at Unknown time  . insulin detemir (LEVEMIR) 100 UNIT/ML injection Inject 5 Units into the skin at bedtime.    11/20/2018 at Unknown time  . insulin lispro (HUMALOG) 100 UNIT/ML injection Inject 14 Units into the skin 3 (three) times daily before meals.    11/21/2018  at Unknown time  . loratadine (CLARITIN) 10 MG tablet Take 10 mg by mouth daily.   11/21/2018 at Unknown time  . magnesium oxide (MAG-OX) 400 (241.3 MG) MG tablet Take 1 tablet (400 mg total) by mouth 2 (two) times daily. (Patient taking differently: Take 400 mg by mouth daily. ) 60 tablet 0 11/21/2018 at Unknown time  . metoprolol tartrate (LOPRESSOR) 50 MG tablet Take 1 tablet (50 mg total) by mouth 2 (two) times daily. Note dose increased (Patient taking differently: Take 50 mg by mouth 2 (two) times daily. Hold if heart rate is less than 60 BPM)   11/21/2018 at 0900  . mirabegron ER (MYRBETRIQ) 50 MG TB24 tablet Take 50 mg by mouth daily. (0900)   11/21/2018 at Unknown time  . montelukast (SINGULAIR) 10 MG tablet Take 10 mg by mouth daily.   11/21/2018 at Unknown time  . pantoprazole (PROTONIX) 40 MG tablet Take 40 mg by mouth daily.   11/21/2018 at Unknown time  . POLYETHYLENE GLYCOL 3350 PO Take 17 g by mouth every other day.    11/21/2018 at Unknown time  . potassium chloride SA (K-DUR,KLOR-CON) 20 MEQ tablet Take 40 mEq by mouth daily.    11/21/2018 at Unknown time  . prednisoLONE acetate (PRED FORTE) 1 % ophthalmic suspension Place 1 drop into the right eye 2 (two) times daily.    11/21/2018 at Unknown time  . sertraline (ZOLOFT) 100 MG tablet Take 1 tablet by mouth every morning.   11/21/2018 at Unknown time  . tamsulosin (FLOMAX) 0.4 MG CAPS capsule Take 0.4 mg by mouth daily.    11/21/2018 at Unknown time  . torsemide (DEMADEX) 20 MG tablet Take 60 mg by mouth daily.    11/20/2018 at Unknown time  . traMADol (ULTRAM) 50 MG tablet Take 25 mg by mouth every 8 (eight) hours as needed (FOR PAIN.).    unk  . vitamin B-12 (CYANOCOBALAMIN) 1000 MCG tablet Take 1,000 mcg by mouth See admin instructions. Take 1 tablet by mouth every 2 days   11/20/2018 at Unknown time  . vitamin C (ASCORBIC ACID) 500 MG tablet Take 500 mg by mouth daily.    11/20/2018 at Unknown time  . Vitamin D, Ergocalciferol, (DRISDOL)  50000 units CAPS capsule Take 50,000 Units by mouth every 30 (thirty) days.    10/25/2018  . zaleplon (SONATA) 5 MG capsule Take 5 mg by mouth at bedtime.    11/20/2018 at Unknown time  . oxyCODONE (OXY IR/ROXICODONE) 5 MG immediate release tablet Take 0.5-1 tablets (2.5-5 mg total) by mouth every 6 (  six) hours as needed for moderate pain, severe pain or breakthrough pain. (Patient not taking: Reported on 11/21/2018) 10 tablet 0 Not Taking at Unknown time    Assessment: 85 y.o. F presents with dyspnea. Pt on apixaban 2.5mg  po BID PTA for afib - dose appropriate with SCr 2.12 and age 68. Last dose 4/28 0900. Hgb down to 9.4, plt wnl.  Goal of Therapy:  Prevention of CVA Monitor platelets by anticoagulation protocol: Yes   Plan:  Continue home apixaban 2.5mg  po BID Pharmacy will sign off - please reconsult if needed  Christoper Fabian, PharmD, BCPS Clinical pharmacist  **Pharmacist phone directory can now be found on [email or web address] ([email or web address]).  Listed under [facility]. 11/22/2018,1:43 AM

## 2018-11-22 NOTE — H&P (Addendum)
TRH H&P    Patient Demographics:    Jocelyn Schultz, is a 85 y.o. female  MRN: [id number]  DOB - 08/18/1930  Admit Date - 11/21/2018  Referring MD/NP/PA:   Marye Round  Outpatient Primary MD for the patient is Josetta Huddle, MD  Patient coming from:  [facility]  Chief complaint- dyspnea   HPI:    Jocelyn Schultz  is a 85 y.o. female, w Hypertension, hyperlipidemia, Dm2, CHF (EF 50-55%), Pafib, Copd (not typically on home o2), apparently dx with RLL infiltrate on CXR 11/17/18, and tx with doxycycline. Pt has c/o dyspnea on extertion, and sent to ER for evaluation.  Pt notes slight dry cough,  Slight lower ext edema as well as 6 lbs weight gain. 1 episode of diarrhea "loose stool" .  Pt denies fever, chills, cp, palp, n/v, abd pain, brbpr, black stool, dysuria.    In ED,  T 98.6, P 60, R 25, Bp 145/125?  Pox 97% on o2 [employer] Wt 74.8 kg  CXR IMPRESSION: Bibasilar atelectasis without definite acute infiltrate.  BNP  479.7 crp 9.8  covid negative Procalcitonin <0.10 LDH 252 Ferritin 58  Wbc 10.6, Hgb 9.4, Plt 217 Na 142, K 4.1, Bun 83, Creatinine 2.12 Alb 2.7 Ast 62, Alt 35, Alk phos 95, T. Bili 0.4  EKG nsr at 60, nl axis, RBBB, no st-t changes c/w ischemia  Pt will be admitted for evaluation of dyspnea, as well as mild ARF on CRF.          Review of systems:    In addition to the HPI above,  No Fever-chills, No Headache, No changes with Vision or hearing, No problems swallowing food or Liquids, No Chest pain  No Abdominal pain, No Nausea or Vomiting, bowel movements are regular, No Blood in stool or Urine, No dysuria, No new skin rashes or bruises, No new joints pains-aches,  No new weakness, tingling, numbness in any extremity, No recent weight gain or loss, No polyuria, polydypsia or polyphagia, No significant Mental Stressors.  All other systems reviewed and are negative.    Past  History of the following :    Past Medical History:  Diagnosis Date  . Acute bronchitis    hx of   . Age-related physical debility   . Allergic rhinitis   . Anemia    iron deficiency per H7P from [facility]  . Asthma    per H&P from [facility]  . Cataract   . CHF (congestive heart failure) (HCC)    acute systolic (congestive)heart failure per H&P from [facility]  . Complication of anesthesia    per sister, states daughter said she is hard to wake up  . Constipation   . COPD (chronic obstructive pulmonary disease) ([place])    per H&P from [facility]  . Dementia ([place])    from progress note from [facility]  . Depression    per H&P from [facility]  . DM2 (diabetes mellitus, type 2) ([place])   . Dry  beriberi   . Dry eye syndrome   . Dyspnea    per H&P from [facility]  . Dysrhythmia    h/o SVT  and atrial flutter from H&P from [facility]  . GERD (gastroesophageal reflux disease)    per H&P from [facility]  . Glaucoma   . Headache    from progress note from [facility]  . HTN (hypertension)    per H&P from [facility]  . Hyperlipidemia   . Hypokalemia    hx of   . Insomnia   . Multifocal atrial tachycardia (HCC)   . Osteoporosis   . Overactive bladder   . Vitamin B deficiency       Past Surgical History:  Procedure Laterality Date  . ANKLE SURGERY Left   . ARTERY BIOPSY Right 04/29/2017   Procedure: RIGHT TEMPORAL ARTERY BIOPSY;  Surgeon: Stark Klein, MD;  Location: [facility];  Service: General;  Laterality: Right;  . DILATION AND CURETTAGE OF UTERUS    . DILATION AND CURETTAGE, DIAGNOSTIC / THERAPEUTIC    . EYE SURGERY  2011, 2012  . FOOT SURGERY Left   . HAND SURGERY Left   . KNEE SURGERY Left 2011      Social History:      Social History   Tobacco Use  . Smoking status: Former Smoker    Types: Cigarettes    Last attempt to quit: 05/13/1987    Years  since quitting: 31.5  . Smokeless tobacco: Never Used  Substance Use Topics  . Alcohol use: Yes    Alcohol/week: 0.0 standard drinks    Comment: very rare (wine at christmas)       Family History :     Family History  Problem Relation Age of Onset  . Leukemia Mother   . Heart attack Father   . Heart Problems Brother   . Other Brother        kidney problems  . Diabetes Mellitus II Sister        Home Medications:   Prior to Admission medications   Medication Sig Start Date End Date Taking? Authorizing Provider  albuterol (VENTOLIN HFA) 108 (90 Base) MCG/ACT inhaler Inhale 2 puffs into the lungs every 4 (four) hours as needed for wheezing or shortness of breath.   Yes [provider]  amoxicillin-clavulanate (AUGMENTIN) 500-125 MG tablet Take 1 tablet by mouth 3 (three) times daily. For 7 days 11/18/18  Yes [provider]  brimonidine (ALPHAGAN) 0.2 % ophthalmic solution Place 1 drop into both eyes 2 (two) times daily.    Yes [provider]  Carboxymethylcellul-Glycerin (REFRESH OPTIVE OP) Place 1 drop into both eyes 4 (four) times daily.    Yes [provider]  doxycycline (VIBRA-TABS) 100 MG tablet Take 100 mg by mouth 2 (two) times daily. For 7 days 11/18/18  Yes [provider]  ELIQUIS 2.5 MG TABS tablet Take 1 tablet by mouth 2 (two) times daily.    Yes [provider]  ferrous sulfate 325 (65 FE) MG tablet Take 325 mg by mouth 2 (two) times a day.    Yes [provider]  fluticasone furoate-vilanterol (BREO ELLIPTA) 100-25 MCG/INH AEPB Inhale 1 puff into the lungs daily.   Yes [provider]  guaiFENesin-dextromethorphan (ROBITUSSIN DM) 100-10 MG/5ML syrup Take 10 mLs by mouth every 4 (four) hours as needed for cough.   Yes [provider]  hydrALAZINE (APRESOLINE) 50 MG tablet Take 50  mg by mouth 3 (three) times daily.  02/25/17  Yes [provider]  Insulin Detemir (LEVEMIR FLEXTOUCH  ) Inject 35 Units into the skin every morning.   Yes [provider]  insulin detemir (LEVEMIR) 100 UNIT/ML injection Inject 5 Units into the skin at bedtime.    Yes [provider]  insulin lispro (HUMALOG) 100 UNIT/ML injection Inject 14 Units into the skin 3 (three) times daily before meals.    Yes [provider]  loratadine (CLARITIN) 10 MG tablet Take 10 mg by mouth daily.   Yes [provider]  magnesium oxide (MAG-OX) 400 (241.3 MG) MG tablet Take 1 tablet (400 mg total) by mouth 2 (two) times daily. Patient taking differently: Take 400 mg by mouth daily.  04/02/15  Yes Bonnielee Haff, MD  metoprolol tartrate (LOPRESSOR) 50 MG tablet Take 1 tablet (50 mg total) by mouth 2 (two) times daily. Note dose increased Patient taking differently: Take 50 mg by mouth 2 (two) times daily. Hold if heart rate is less than 60 BPM 05/24/17  Yes Crenshaw, Denice Bors, MD  mirabegron ER (MYRBETRIQ) 50 MG TB24 tablet Take 50 mg by mouth daily. (0900)   Yes [provider]  montelukast (SINGULAIR) 10 MG tablet Take 10 mg by mouth daily.   Yes [provider]  pantoprazole (PROTONIX) 40 MG tablet Take 40 mg by mouth daily.   Yes [provider]  POLYETHYLENE GLYCOL 3350 PO Take 17 g by mouth every other day.    Yes [provider]  potassium chloride SA (K-DUR,KLOR-CON) 20 MEQ tablet Take 40 mEq by mouth daily.    Yes [provider]  prednisoLONE acetate (PRED FORTE) 1 % ophthalmic suspension Place 1 drop into the right eye 2 (two) times daily.    Yes [provider]  sertraline (ZOLOFT) 100 MG tablet Take 1 tablet by mouth every morning. 02/02/18  Yes [provider]  tamsulosin (FLOMAX) 0.4 MG CAPS capsule Take 0.4 mg by mouth daily.    Yes [provider]  torsemide (DEMADEX) 20 MG tablet Take 60 mg by mouth daily.    Yes [provider]  traMADol (ULTRAM) 50 MG tablet Take 25 mg by mouth every 8  (eight) hours as needed (FOR PAIN.).    Yes [provider]  vitamin B-12 (CYANOCOBALAMIN) 1000 MCG tablet Take 1,000 mcg by mouth See admin instructions. Take 1 tablet by mouth every 2 days   Yes [provider]  vitamin C (ASCORBIC ACID) 500 MG tablet Take 500 mg by mouth daily.    Yes [provider]  Vitamin D, Ergocalciferol, (DRISDOL) 50000 units CAPS capsule Take 50,000 Units by mouth every 30 (thirty) days.    Yes [provider]  zaleplon (SONATA) 5 MG capsule Take 5 mg by mouth at bedtime.    Yes [provider]  oxyCODONE (OXY IR/ROXICODONE) 5 MG immediate release tablet Take 0.5-1 tablets (2.5-5 mg total) by mouth every 6 (six) hours as needed for moderate pain, severe pain or breakthrough pain. Patient not taking: Reported on 11/21/2018 04/29/17   Earnstine Regal, PA-C     Allergies:     Allergies  Allergen Reactions  . Aspirin Other (See Comments)    Other Reaction: mouth blisters  . Saccharin Other (See Comments)    Other Reaction: oral blisters with art. sweetn  . Soap Other (See Comments)    Other Reaction: dial-drys skin     Physical Exam:   Vitals  Blood pressure (!) 143/67, pulse 71, temperature 98.4 F (36.9 C), temperature source Oral, resp. rate (!) 26, height '5\' 2"'  (1.575 m), weight 74.8 kg, SpO2 95 %.  1.  General: axox3  2. Psychiatric: euthymic  3. Neurologic: cn2-12 intact, reflexes 2+ symmetric, diffuse w no clonus, motor 5/5 in all 4 ext  4. HEENMT:  Anicteric, pupils 1.50m symmetric, direct, consensual, near intact No jvd  5. Respiratory : Decrease in bs at bilateral base, no crackles, no wheezing  6. Cardiovascular : rrr s1, s2, 1/6 sem apex  7. Gastrointestinal:  Abd: soft, obese, nt, nd, +bs  8. Skin:  Ext: no c/c,  Trace edema, no rash  9.Musculoskeletal:  Good ROM  No adenopathy    Data Review:    CBC Recent Labs  Lab 11/21/18 2141  WBC 10.6*  HGB 9.4*  HCT 31.3*  PLT  217  MCV 89.9  MCH 27.0  MCHC 30.0  RDW 17.2*  LYMPHSABS 1.3  MONOABS 0.8  EOSABS 0.5  BASOSABS 0.1   ------------------------------------------------------------------------------------------------------------------  Results for orders placed or performed during the hospital encounter of 11/21/18 (from the past 48 hour(s))  [facility] Coron[facility]     Status: None   Collection Time: 11/21/18  9:41 PM  Result Value Ref Range   SARS Coronavirus 2 NEGATIVE NEGATIVE    Comment: (NOTE) If result is NEGATIVE SARS-CoV-2 target nucleic acids are NOT DETECTED. The SARS-CoV-2 RNA is generally detectable in upper and lower  respiratory specimens during the acute phase of infection. The lowest  concentration of SARS-CoV-2 viral copies this assay can detect is 250  copies / mL. A negative result does not preclude SARS-CoV-2 infection  and should not be used as the sole basis for treatment or other  patient management decisions.  A negative result may occur with  improper specimen collection / handling, submission of specimen other  than nasopharyngeal swab, presence of viral mutation(s) within the  areas targeted by this assay, and inadequate number of viral copies  (<250 copies / mL). A negative result must be combined with clinical  observations, patient history, and epidemiological information. If result is POSITIVE SARS-CoV-2 target nucleic acids are DETECTED. The SARS-CoV-2 RNA is generally detectable in upper and lower  respiratory specimens dur ing the acute phase of infection.  Positive  results are indicative of active infection with SARS-CoV-2.  Clinical  correlation with patient history and other diagnostic information is  necessary to determine patient infection status.  Positive results do  not rule out bacterial infection or co-infection with other viruses. If result is PRESUMPTIVE POSTIVE SARS-CoV-2 nucleic acids MAY BE  PRESENT.   A presumptive positive result was obtained on the submitted specimen  and confirmed on repeat testing.  While 2019 novel coronavirus  (SARS-CoV-2) nucleic acids may be present in the submitted sample  additional confirmatory testing may be necessary for epidemiological  and / or clinical management purposes  to differentiate between  SARS-CoV-2 and other Sarbecovirus currently known to infect humans.  If clinically indicated additional testing with an alternate test  methodology [phone] is advised. The SARS-CoV-2 RNA is generally  detectable in upper and lower respiratory sp ecimens during the acute  phase of infection. The expected result is Negative. Fact Sheet for Patients:  hStrictlyIdeas.noFact Sheet for Healthcare Providers: hBankingDealers.co.zaThis test is not yet approved or cleared by the [place] and has been authorized for detection and/or diagnosis of SARS-CoV-2  by FDA under an Emergency Use Authorization (EUA).  This EUA will remain in effect (meaning this test can be used) for the duration of the COVID-19 declaration under Section 564(b)(1) of the Act, 21 U.S.C. section 360bbb-3(b)(1), unless the authorization is terminated or revoked sooner. Performed at [facility], [place] [address], [place], [place] [address]   Lactic acid, plasma     Status: None   Collection Time: 11/21/18  9:41 PM  Result Value Ref Range   Lactic Acid, Venous 1.4 0.5 - 1.9 mmol/L    Comment: Performed at [facility] [address], [place], [place] [address]  CBC WITH DIFFERENTIAL     Status: Abnormal   Collection Time: 11/21/18  9:41 PM  Result Value Ref Range   WBC 10.6 (H) 4.0 - 10.5 K/uL   RBC 3.48 (L) 3.87 - 5.11 MIL/uL   Hemoglobin 9.4 (L) 12.0 - 15.0 g/dL   HCT 31.3 (L) 36.0 - 46.0 %   MCV 89.9 80.0 - 100.0 fL   MCH 27.0 26.0 - 34.0 pg   MCHC 30.0 30.0 - 36.0 g/dL   RDW 17.2 (H) 11.5 - 15.5 %    Platelets 217 150 - 400 K/uL   nRBC 0.0 0.0 - 0.2 %   Neutrophils Relative % 74 %   Neutro Abs 7.9 (H) 1.7 - 7.7 K/uL   Lymphocytes Relative 12 %   Lymphs Abs 1.3 0.7 - 4.0 K/uL   Monocytes Relative 7 %   Monocytes Absolute 0.8 0.1 - 1.0 K/uL   Eosinophils Relative 5 %   Eosinophils Absolute 0.5 0.0 - 0.5 K/uL   Basophils Relative 1 %   Basophils Absolute 0.1 0.0 - 0.1 K/uL   Immature Granulocytes 1 %   Abs Immature Granulocytes 0.06 0.00 - 0.07 K/uL    Comment: Performed at [facility], [address], [place], [place] [address]  Comprehensive metabolic panel     Status: Abnormal   Collection Time: 11/21/18  9:41 PM  Result Value Ref Range   Sodium 142 135 - 145 mmol/L   Potassium 4.1 3.5 - 5.1 mmol/L   Chloride 103 98 - 111 mmol/L   CO2 27 22 - 32 mmol/L   Glucose, Bld 88 70 - 99 mg/dL   BUN 83 (H) 8 - 23 mg/dL   Creatinine, Ser 2.12 (H) 0.44 - 1.00 mg/dL   Calcium 9.2 8.9 - 10.3 mg/dL   Total Protein 8.2 (H) 6.5 - 8.1 g/dL   Albumin 2.7 (L) 3.5 - 5.0 g/dL   AST 62 (H) 15 - 41 U/L   ALT 35 0 - 44 U/L   Alkaline Phosphatase 95 38 - 126 U/L   Total Bilirubin 0.4 0.3 - 1.2 mg/dL   GFR calc non Af Amer 21 (L) >60 mL/min   GFR calc Af Amer 24 (L) >60 mL/min   Anion gap 12 5 - 15    Comment: Performed at [facility], [place] [address] [place], [place] [address]  D-dimer, quantitative     Status: Abnormal   Collection Time: 11/21/18  9:41 PM  Result Value Ref Range   D-Dimer, Quant 0.70 (H) 0.00 - 0.50 ug/mL-FEU    Comment: (NOTE) At the manufacturer cut-off of 0.50 ug/mL FEU, this assay has been documented to exclude PE with a sensitivity and negative predictive value of 97 to 99%.  At this time, this assay has not been approved by the FDA to exclude DVT/VTE.  Results should be correlated with clinical presentation. Performed at [facility], [place] [address] [place], [place] [address]   Procalcitonin     Status: None   Collection Time: 11/21/18  9:41 PM   Result Value Ref Range   Procalcitonin <0.10 ng/mL    Comment:        Interpretation: PCT (Procalcitonin) <= 0.5 ng/mL: Systemic infection (sepsis) is not likely. Local bacterial infection is possible. (NOTE)       Sepsis PCT Algorithm           Lower Respiratory Tract                                      Infection PCT Algorithm    ----------------------------     ----------------------------         PCT < 0.25 ng/mL                PCT < 0.10 ng/mL         Strongly encourage             Strongly discourage   discontinuation of antibiotics    initiation of antibiotics    ----------------------------     -----------------------------       PCT 0.25 - 0.50 ng/mL            PCT 0.10 - 0.25 ng/mL               OR       >80% decrease in PCT            Discourage initiation of                                            antibiotics      Encourage discontinuation           of antibiotics    ----------------------------     -----------------------------         PCT >= 0.50 ng/mL              PCT 0.26 - 0.50 ng/mL               AND        <80% decrease in PCT             Encourage initiation of                                             antibiotics       Encourage continuation           of antibiotics    ----------------------------     -----------------------------        PCT >= 0.50 ng/mL                  PCT > 0.50 ng/mL               AND         increase in PCT                  Strongly encourage  initiation of antibiotics    Strongly encourage escalation           of antibiotics                                     -----------------------------                                           PCT <= 0.25 ng/mL                                                 OR                                        > 80% decrease in PCT                                     Discontinue / Do not initiate                                             antibiotics Performed  at [facility], [address] [place], [place] [address]   Lactate dehydrogenase     Status: Abnormal   Collection Time: 11/21/18  9:41 PM  Result Value Ref Range   LDH 252 (H) 98 - 192 U/L    Comment: Performed at [facility], [place] [address] [place], [place] [address]  Ferritin     Status: None   Collection Time: 11/21/18  9:41 PM  Result Value Ref Range   Ferritin 58 11 - 307 ng/mL    Comment: Performed at [facility] [address], [place], [place] [address]  Triglycerides     Status: None   Collection Time: 11/21/18  9:41 PM  Result Value Ref Range   Triglycerides 40 <150 mg/dL    Comment: Performed at [facility] [address], [place], [place] [address]  Fibrinogen     Status: Abnormal   Collection Time: 11/21/18  9:41 PM  Result Value Ref Range   Fibrinogen 557 (H) 210 - 475 mg/dL    Comment: Performed at [facility] [address], [place], [place] [address]  C-reactive protein     Status: Abnormal   Collection Time: 11/21/18  9:41 PM  Result Value Ref Range   CRP 9.8 (H) <1.0 mg/dL    Comment: Performed at [facility], [place] [address] [place], [place] [address]  Brain natriuretic peptide     Status: Abnormal   Collection Time: 11/21/18  9:41 PM  Result Value Ref Range   B Natriuretic Peptide 479.7 (H) 0.0 - 100.0 pg/mL    Comment: Performed at [facility] [address], [place], [place] [address]    Chemistries  Recent Labs  Lab 11/21/18 2141  NA 142  K 4.1  CL 103  CO2 27  GLUCOSE 88  BUN 83*  CREATININE 2.12*  CALCIUM 9.2  AST 62*  ALT 35  ALKPHOS 95  BILITOT 0.4   ------------------------------------------------------------------------------------------------------------------  ------------------------------------------------------------------------------------------------------------------ GFR: Estimated Creatinine Clearance: 18.4 mL/min (A) (by C-G formula based on SCr of 2.12 mg/dL (H)).  Liver Function Tests: Recent Labs  Lab 11/21/18 2141  AST 62*  ALT 35  ALKPHOS 95  BILITOT 0.4  PROT 8.2*  ALBUMIN 2.7*   No results for input(s): LIPASE, AMYLASE in the last 168 hours. No results for input(s): AMMONIA in the last 168 hours. Coagulation Profile: No results for input(s): INR, PROTIME in the last 168 hours. Cardiac Enzymes: No results for input(s): CKTOTAL, CKMB, CKMBINDEX, TROPONINI in the last 168 hours. BNP (last 3 results) No results for input(s): PROBNP in the last 8760 hours. HbA1C: No results for input(s): HGBA1C in the last 72 hours. CBG: No results for input(s): GLUCAP in the last 168 hours. Lipid Profile: Recent Labs    11/21/18 2141  TRIG 40   Thyroid Function Tests: No results for input(s): TSH, T4TOTAL, FREET4, T3FREE, THYROIDAB in the last 72 hours. Anemia Panel: Recent Labs    11/21/18 2141  FERRITIN 58    --------------------------------------------------------------------------------------------------------------- Urine analysis:    Component Value Date/Time   COLORURINE YELLOW 03/31/2015 2313   APPEARANCEUR CLOUDY (A) 03/31/2015 2313   LABSPEC 1.013 03/31/2015 2313   PHURINE 6.5 03/31/2015 2313   GLUCOSEU NEGATIVE 03/31/2015 2313   HGBUR NEGATIVE 03/31/2015 2313   BILIRUBINUR NEGATIVE 03/31/2015 2313   KETONESUR NEGATIVE 03/31/2015 2313   PROTEINUR NEGATIVE 03/31/2015 2313   UROBILINOGEN 1.0 03/31/2015 2313   NITRITE NEGATIVE 03/31/2015 2313   LEUKOCYTESUR TRACE (A) 03/31/2015 2313      Imaging Results:    Dg Chest Port 1 View  Result Date: 11/21/2018 CLINICAL DATA:  Shortness of breath, cough, history COPD, CHF, multifocal atrial tachycardia, hypertension, diabetes mellitus, asthma, former smoker EXAM: PORTABLE CHEST 1 VIEW COMPARISON:  Portable exam 2031 hours compared to 03/07/2018 FINDINGS: Borderline enlargement of cardiac silhouette. Mediastinal contours and pulmonary vascularity normal. Minimal chronic interstitial  prominence. Mild bibasilar subsegmental atelectasis. Chronic central peribronchial thickening, slightly decreased. No definite acute infiltrate, pleural effusion or pneumothorax. Bones demineralized. IMPRESSION: Bibasilar atelectasis without definite acute infiltrate. Electronically Signed   By: Lavonia Dana M.D.   On: 11/21/2018 20:41     Assessment & Plan:    Principal Problem:   Dyspnea Active Problems:   Diabetes (HCC)   CHF (congestive heart failure) (HCC)   ARF (acute renal failure) (HCC)   Protein-calorie malnutrition, severe (HCC)   Dyspnea unclear etiology, recently tx for pneumonia with doxycycline 4/24-4/29, ddx combination of mild Copd exacerbation with underlying CHF, anemia Check CT chest w/o contrast Tele Trop I q6h x3 Check cardiac echo Solumedrol 6m iv q8h Anoro 1puff qday Albuterol neb tid, and q6h prn  Zithromax 5074miv qday  ARF Pt received ns in ED, no further fluid due to hx of CHF Check CT abd/ pelvis r/o hydronephrosis Check cmp in am  D dimer elevation Will not w/up for PE since already on Eliquis  Abnormal liver function Check Abdomen/ pelvis w/o contrast  H/o CHF (diastolic) Cont hydralazine 5080mo tid Cont Metoprolol 58m7m bid Cont Torsemide 60mg76mqday Cont Potassium 40meq27mqday  Pafib Cont Metoprolol as above Cont Eliquis Pharmacy to dose  Anemia Check iron, tibc, b12, folate, ESR If ESR elevated consider spep, upep Check cbc in am  Copd DC Breo  Start Anoro as above  Dm2 Cont Levemir fsbs ac and qhs, ISS  Severe protein calorie malnutrition prostat 7m po bid  Urinary incontinence Cont Myrbetriq Cont Flomax  Anxiety Cont Zoloft 1036mpo qday  Gerd Cont PPI, consider alternative agent since PPI has hx of association with ? Renal failure   DVT Prophylaxis-   Eliquis  AM Labs Ordered, also please review Full Orders  Family Communication: Admission, patients condition and plan of care including tests being  ordered have been discussed with the patient  who indicate understanding and agree with the plan and Code Status.  Code Status:  DNR  Admission status:  observation: Based on patients clinical presentation and evaluation of above clinical data, I have made determination that patient meets observation criteria at this time  Time spent in minutes : 70   JaJani Gravel.D on 11/22/2018 at 1:47 AM

## 2018-11-22 NOTE — Progress Notes (Signed)
Patient down in 40 sustain in 50, pt. Denies chest pain, stated she did not feel any different, BP 113/55. MD notified, no new order given, MD instruct to continue to monitor the patient.

## 2018-11-22 NOTE — Progress Notes (Signed)
Pt troponin is 0.05. [facility] has been paged.

## 2018-11-23 DIAGNOSIS — N170 Acute kidney failure with tubular necrosis: Secondary | ICD-10-CM

## 2018-11-23 LAB — BASIC METABOLIC PANEL
Anion gap: 13 (ref 5–15)
BUN: 75 mg/dL — ABNORMAL HIGH (ref 8–23)
CO2: 26 mmol/L (ref 22–32)
Calcium: 8.6 mg/dL — ABNORMAL LOW (ref 8.9–10.3)
Chloride: 102 mmol/L (ref 98–111)
Creatinine, Ser: 1.78 mg/dL — ABNORMAL HIGH (ref 0.44–1.00)
GFR calc Af Amer: 30 mL/min — ABNORMAL LOW (ref >60)
GFR calc non Af Amer: 26 mL/min — ABNORMAL LOW (ref >60)
Glucose, Bld: 448 mg/dL — ABNORMAL HIGH (ref 70–99)
Potassium: 4.2 mmol/L (ref 3.5–5.1)
Sodium: 141 mmol/L (ref 135–145)

## 2018-11-23 LAB — FOLATE RBC
Folate, Hemolysate: 462 ng/mL
Folate, RBC: 1396 ng/mL (ref >498)
Hematocrit: 33.1 % — ABNORMAL LOW (ref 34.0–46.6)

## 2018-11-23 LAB — TROPONIN I
Troponin I: 0.08 ng/mL (ref <0.03)
Troponin I: 0.05 ng/mL (ref <0.03)

## 2018-11-23 MED ORDER — PREDNISONE 10 MG PO TABS: 20.0000 mg | ORAL_TABLET | Freq: Every day | ORAL | 0 refills | Status: AC

## 2018-11-23 MED ORDER — TRAMADOL HCL 50 MG PO TABS: 25.0000 mg | ORAL_TABLET | Freq: Three times a day (TID) | ORAL | 0 refills | Status: AC | PRN

## 2018-11-23 MED ORDER — AMOXICILLIN-POT CLAVULANATE 500-125 MG PO TABS: 1.0000 | ORAL_TABLET | Freq: Three times a day (TID) | ORAL | 0 refills | Status: AC

## 2018-11-23 NOTE — TOC Transition Note (Signed)
Transition of Care [name] - CM/SW Discharge Note   Patient Details  Name: Jocelyn Schultz MRN: [id number] Date of Birth: [birth year]  Transition of Care [facility] CM/SW Contact:  Margarito Liner, LCSW Phone Number: 11/23/2018, 12:36 PM   Clinical Narrative: Readmission prevention screening complete.  CSW facilitated patient discharge including contacting patient family and facility to confirm patient discharge plans. Clinical information faxed to facility and family agreeable with plan. CSW arranged ambulance transport via [facility] to [facility]. RN to call report prior to discharge [phone] Room 701B).  CSW will sign off for now as social work intervention is no longer needed. Please consult [place] again if new needs arise.  Final next level of care: Skilled Nursing Facility Barriers to Discharge: Barriers Resolved   Patient Goals and CMS Choice   CMS Medicare.gov Compare Post Acute Care list provided to:: Other (Comment Required)(Patient is long-term at [facility] [place].)    Discharge Placement   Existing PASRR number confirmed : 11/23/18          Patient chooses bed at: [facility] [place] Patient to be transferred to facility by: [facility] Name of family member notified: Philis Nettle Patient and family notified of of transfer: 11/23/18  Discharge Plan and Services     Post Acute Care Choice: Skilled Nursing Facility                               Social Determinants of Health (SDOH) Interventions     Readmission Risk Interventions Readmission Risk Prevention Plan 11/23/2018  Transportation Screening Complete  HRI or Home Care Consult (No Data)  Social Work Consult for Recovery Care Planning/Counseling Patient refused  Palliative Care Screening Not Applicable  Medication Review Oceanographer) Complete  Some recent data might be hidden

## 2018-11-23 NOTE — Discharge Summary (Signed)
Physician Discharge Summary  Jocelyn Schultz XBM:[id number] DOB: 1930-10-17 DOA: 11/21/2018  PCP: Marden Noble, MD  Admit date: 11/21/2018 Discharge date: 11/23/2018  Admitted From: SNF Disposition:  [facility]   Recommendations for Outpatient Follow-up:  1. Follow up with PCP in 1-2 weeks 2. Check BMP in 1 week   Home Health:N/A  Equipment/Devices:N/A   Discharge Condition: stable   CODE STATUS:DNR/DNI  Diet recommendation: low carbohydrate diet   Brief/Interim Summary: Patient is a 85 year old female with history of hypertension, hyperlipidemia, type 2 diabetes on insulin, chronic diastolic heart failure with known ejection fraction 55%, paroxysmal atrial fibrillation, COPD on nocturnal oxygen.  She lives at [facility].  She had recently suffered from right lower lobe infiltrate and was treated with doxycycline.  Patient had complained of increasing shortness of breath.  She was also found to have elevated creatinine at the nursing home, she was treated with IV fluids and then developed more shortness of breath so she was sent to the ER.  Patient had slight dry cough otherwise no fever.  Her assessment plan as described below.  Discharge Diagnoses:  Principal Problem:   Dyspnea Active Problems:   Diabetes (HCC)   CHF (congestive heart failure) (HCC)   ARF (acute renal failure) (HCC)   Protein-calorie malnutrition, severe (HCC)  Shortness of breath: Probably multifactorial.  Suspect moderate fluid overload due to recent treatment with IV fluids and underlying A. fib with diastolic dysfunction.  Repeat echocardiogram shows ejection fraction of 60%.  Patient has rate controlled A. fib.  She was treated with resumption of torsemide and has mostly improved symptoms.  She will continue to take torsemide.  Suspected pneumonia: Incompletely treated.  She was treated with azithromycin and steroids.  Symptoms have mostly improved.  Cultures are negative.  COVID-19 was negative.   Patient will continue to finish her Augmentin for 4 more days.  She will take low-dose steroids for 3 more days.  She will continue chest physiotherapy, incentive spirometry and deep breathing exercises, inhalation steroids and bronchodilator therapy at the nursing home.  Acute kidney injury on chronic kidney disease stage III: Her creatinine was reportedly 2.3 before coming to the hospital.  She was treated with torsemide, creatinine is improving, 2.2-1.87-1.78.  Good urine output.  No evidence of urinary obstruction on CT scan of the abdomen pelvis.  She will resume torsemide.  She will need a repeat BMP in about 1 week to ensure stabilization.  Her baseline creatinine in the hospital system is about 1.3.  Paroxysmal A. fib: Rate controlled.  Occasionally bradycardic.  Metoprolol with holding parameters.  She is therapeutic on Eliquis.  Abnormal CT scan of the chest: She underwent CT scan of the chest abdomen pelvis on presentation.  CT scan of the chest showed right supraclavicular lymphadenopathy, no other lymphadenopathy including in the chest and abdomen.  Significance unknown.  Recently suffered from URI symptoms.  I discussed this with patient and her sister.  Given her comorbidities, advanced age, not recommended and patient also does not want have it investigated or biopsied and this is reasonable plan.  Type 2 diabetes on insulin: Fairly controlled.  She will resume her long-acting insulin and prandial insulin.  Patient has multiple medical problems, however she is currently fairly stable.  Patient will be transferred back to a skilled level of care today.  She will need repeat BMP in 1 week.  She will finish her antibiotics in another 4 days.   Discharge Instructions  Discharge Instructions  Call MD for:  difficulty breathing, headache or visual disturbances   Complete by:  As directed    Diet - low sodium heart healthy   Complete by:  As directed    Discharge instructions    Complete by:  As directed    Continue to use chest physiotherapy, incentive spirometry and nebulizer treatments   Increase activity slowly   Complete by:  As directed      Allergies as of 11/23/2018      Reactions   Aspirin Other (See Comments)   Other Reaction: mouth blisters   Saccharin Other (See Comments)   Other Reaction: oral blisters with art. sweetn   Soap Other (See Comments)   Other Reaction: dial-drys skin      Medication List    STOP taking these medications   doxycycline 100 MG tablet Commonly known as:  VIBRA-TABS     TAKE these medications   albuterol 108 (90 Base) MCG/ACT inhaler Commonly known as:  VENTOLIN HFA Inhale 2 puffs into the lungs every 4 (four) hours as needed for wheezing or shortness of breath.   amoxicillin-clavulanate 500-125 MG tablet Commonly known as:  AUGMENTIN Take 1 tablet (500 mg total) by mouth 3 (three) times daily for 4 days. For 7 days   Breo Ellipta 100-25 MCG/INH Aepb Generic drug:  fluticasone furoate-vilanterol Inhale 1 puff into the lungs daily.   brimonidine 0.2 % ophthalmic solution Commonly known as:  ALPHAGAN Place 1 drop into both eyes 2 (two) times daily.   Eliquis 2.5 MG Tabs tablet Generic drug:  apixaban Take 1 tablet by mouth 2 (two) times daily.   ferrous sulfate 325 (65 FE) MG tablet Take 325 mg by mouth 2 (two) times a day.   guaiFENesin-dextromethorphan 100-10 MG/5ML syrup Commonly known as:  ROBITUSSIN DM Take 10 mLs by mouth every 4 (four) hours as needed for cough.   hydrALAZINE 50 MG tablet Commonly known as:  APRESOLINE Take 50 mg by mouth 3 (three) times daily.   insulin detemir 100 UNIT/ML injection Commonly known as:  LEVEMIR Inject 5 Units into the skin at bedtime.   LEVEMIR FLEXTOUCH [place] Inject 35 Units into the skin every morning.   insulin lispro 100 UNIT/ML injection Commonly known as:  HUMALOG Inject 14 Units into the skin 3 (three) times daily before meals.   loratadine 10 MG  tablet Commonly known as:  CLARITIN Take 10 mg by mouth daily.   magnesium oxide 400 (241.3 Mg) MG tablet Commonly known as:  MAG-OX Take 1 tablet (400 mg total) by mouth 2 (two) times daily. What changed:  when to take this   metoprolol tartrate 50 MG tablet Commonly known as:  LOPRESSOR Take 1 tablet (50 mg total) by mouth 2 (two) times daily. Note dose increased What changed:  additional instructions   montelukast 10 MG tablet Commonly known as:  SINGULAIR Take 10 mg by mouth daily.   Myrbetriq 50 MG Tb24 tablet Generic drug:  mirabegron ER Take 50 mg by mouth daily. (0900)   pantoprazole 40 MG tablet Commonly known as:  PROTONIX Take 40 mg by mouth daily.   POLYETHYLENE GLYCOL 3350 PO Take 17 g by mouth every other day.   potassium chloride SA 20 MEQ tablet Commonly known as:  K-DUR Take 40 mEq by mouth daily.   prednisoLONE acetate 1 % ophthalmic suspension Commonly known as:  PRED FORTE Place 1 drop into the right eye 2 (two) times daily.   predniSONE 10 MG tablet  Commonly known as:  DELTASONE Take 2 tablets (20 mg total) by mouth daily for 3 days.   REFRESH OPTIVE OP Place 1 drop into both eyes 4 (four) times daily.   sertraline 100 MG tablet Commonly known as:  ZOLOFT Take 1 tablet by mouth every morning.   tamsulosin 0.4 MG Caps capsule Commonly known as:  FLOMAX Take 0.4 mg by mouth daily.   torsemide 20 MG tablet Commonly known as:  DEMADEX Take 60 mg by mouth daily.   traMADol 50 MG tablet Commonly known as:  ULTRAM Take 0.5 tablets (25 mg total) by mouth every 8 (eight) hours as needed for up to 3 days (FOR PAIN.).   vitamin B-12 1000 MCG tablet Commonly known as:  CYANOCOBALAMIN Take 1,000 mcg by mouth See admin instructions. Take 1 tablet by mouth every 2 days   vitamin C 500 MG tablet Commonly known as:  ASCORBIC ACID Take 500 mg by mouth daily.   Vitamin D (Ergocalciferol) 1.25 MG (50000 UT) Caps capsule Commonly known as:   DRISDOL Take 50,000 Units by mouth every 30 (thirty) days.   zaleplon 5 MG capsule Commonly known as:  SONATA Take 5 mg by mouth at bedtime.       Allergies  Allergen Reactions  . Aspirin Other (See Comments)    Other Reaction: mouth blisters  . Saccharin Other (See Comments)    Other Reaction: oral blisters with art. sweetn  . Soap Other (See Comments)    Other Reaction: dial-drys skin    Consultations:  None.   Procedures/Studies: Ct Abdomen Pelvis Wo Contrast  Result Date: 11/22/2018 CLINICAL DATA:  85 year old female with shortness of breath, cough, renal failure, abnormal LFTs. EXAM: CT CHEST, ABDOMEN AND PELVIS WITHOUT CONTRAST TECHNIQUE: Multidetector CT imaging of the chest, abdomen and pelvis was performed following the standard protocol without IV contrast. COMPARISON:  Portable chest 11/21/2018. CT Abdomen and Pelvis 07/27/2016. FINDINGS: CT CHEST FINDINGS Cardiovascular: Calcified coronary artery and aortic atherosclerosis. Vascular patency is not evaluated in the absence of IV contrast. Mild cardiomegaly. No pericardial effusion. Mediastinum/Nodes: Partially visible right supraclavicular lymphadenopathy, with increased size and number of nodes there individually up to 15 millimeter short axis (series 3, image 2). No prior neck or chest CT. Axillary and mediastinal lymph nodes are normal by size criteria, but perhaps mildly increased in number. Lungs/Pleura: Major airways are patent. There is dependent atelectasis in both lungs. There is bilateral mosaic attenuation. Additional more abnormal peribronchial ground-glass opacity is noted in the right upper lobe on series 4, image 40. But no other confluent areas of opacity. No pleural effusion. Musculoskeletal: No acute osseous abnormality identified. CT ABDOMEN PELVIS FINDINGS Hepatobiliary: Negative noncontrast liver and gallbladder. Pancreas: Chronically atrophied. Spleen: Negative. Adrenals/Urinary Tract: Normal adrenal  glands. Negative noncontrast kidneys; there is a chronic simple fluid density right renal lower pole cysts. No hydronephrosis. Proximal ureters are decompressed. No nephrolithiasis. Distal ureters are decompressed. The urinary bladder is mildly distended but otherwise unremarkable. Stomach/Bowel: Negative rectum. Negative sigmoid colon aside from redundancy. Decompressed and negative descending colon and splenic flexure. Mildly redundant transverse colon with mild retained stool. Negative right colon. The cecum is on a lax mesentery. Negative terminal ileum. The appendix is not definitely identified but might be normal on series 3, image 96. There is no pericecal inflammation. No dilated small bowel. Decompressed stomach. No free air, free fluid. Vascular/Lymphatic: Extensive Aortoiliac calcified atherosclerosis. Vascular patency is not evaluated in the absence of IV contrast. There is no lymphadenopathy  in the abdomen or pelvis. Reproductive: Negative. Other: No pelvic free fluid. Musculoskeletal: No acute osseous abnormality identified. IMPRESSION: 1. Partially visible right supraclavicular lymphadenopathy, and questionably increased number of small lymph nodes in the chest. No lymphadenopathy in the abdomen or pelvis. No prior neck or chest CT to compare. Lymphoproliferative disorder or cervical Metastatic Nodal Disease cannot be excluded. 2. Bilateral pulmonary mosaic attenuation might reflect gas trapping, but superimposed right upper lobe peribronchial ground-glass is suspicious for Acute Respiratory Infection. No pleural effusion. 3. No obstructive uropathy, acute or inflammatory process identified in the non-contrast abdomen or pelvis. 4. Calcified coronary artery and Aortic Atherosclerosis (ICD10-I70.0). Mild cardiomegaly. Electronically Signed   By: Odessa Fleming M.D.   On: 11/22/2018 03:36   Ct Chest Wo Contrast  Result Date: 11/22/2018 CLINICAL DATA:  85 year old female with shortness of breath, cough,  renal failure, abnormal LFTs. EXAM: CT CHEST, ABDOMEN AND PELVIS WITHOUT CONTRAST TECHNIQUE: Multidetector CT imaging of the chest, abdomen and pelvis was performed following the standard protocol without IV contrast. COMPARISON:  Portable chest 11/21/2018. CT Abdomen and Pelvis 07/27/2016. FINDINGS: CT CHEST FINDINGS Cardiovascular: Calcified coronary artery and aortic atherosclerosis. Vascular patency is not evaluated in the absence of IV contrast. Mild cardiomegaly. No pericardial effusion. Mediastinum/Nodes: Partially visible right supraclavicular lymphadenopathy, with increased size and number of nodes there individually up to 15 millimeter short axis (series 3, image 2). No prior neck or chest CT. Axillary and mediastinal lymph nodes are normal by size criteria, but perhaps mildly increased in number. Lungs/Pleura: Major airways are patent. There is dependent atelectasis in both lungs. There is bilateral mosaic attenuation. Additional more abnormal peribronchial ground-glass opacity is noted in the right upper lobe on series 4, image 40. But no other confluent areas of opacity. No pleural effusion. Musculoskeletal: No acute osseous abnormality identified. CT ABDOMEN PELVIS FINDINGS Hepatobiliary: Negative noncontrast liver and gallbladder. Pancreas: Chronically atrophied. Spleen: Negative. Adrenals/Urinary Tract: Normal adrenal glands. Negative noncontrast kidneys; there is a chronic simple fluid density right renal lower pole cysts. No hydronephrosis. Proximal ureters are decompressed. No nephrolithiasis. Distal ureters are decompressed. The urinary bladder is mildly distended but otherwise unremarkable. Stomach/Bowel: Negative rectum. Negative sigmoid colon aside from redundancy. Decompressed and negative descending colon and splenic flexure. Mildly redundant transverse colon with mild retained stool. Negative right colon. The cecum is on a lax mesentery. Negative terminal ileum. The appendix is not  definitely identified but might be normal on series 3, image 96. There is no pericecal inflammation. No dilated small bowel. Decompressed stomach. No free air, free fluid. Vascular/Lymphatic: Extensive Aortoiliac calcified atherosclerosis. Vascular patency is not evaluated in the absence of IV contrast. There is no lymphadenopathy in the abdomen or pelvis. Reproductive: Negative. Other: No pelvic free fluid. Musculoskeletal: No acute osseous abnormality identified. IMPRESSION: 1. Partially visible right supraclavicular lymphadenopathy, and questionably increased number of small lymph nodes in the chest. No lymphadenopathy in the abdomen or pelvis. No prior neck or chest CT to compare. Lymphoproliferative disorder or cervical Metastatic Nodal Disease cannot be excluded. 2. Bilateral pulmonary mosaic attenuation might reflect gas trapping, but superimposed right upper lobe peribronchial ground-glass is suspicious for Acute Respiratory Infection. No pleural effusion. 3. No obstructive uropathy, acute or inflammatory process identified in the non-contrast abdomen or pelvis. 4. Calcified coronary artery and Aortic Atherosclerosis (ICD10-I70.0). Mild cardiomegaly. Electronically Signed   By: Odessa Fleming M.D.   On: 11/22/2018 03:36   Dg Chest Port 1 View  Result Date: 11/21/2018 CLINICAL DATA:  Shortness of breath,  cough, history COPD, CHF, multifocal atrial tachycardia, hypertension, diabetes mellitus, asthma, former smoker EXAM: PORTABLE CHEST 1 VIEW COMPARISON:  Portable exam 2031 hours compared to 03/07/2018 FINDINGS: Borderline enlargement of cardiac silhouette. Mediastinal contours and pulmonary vascularity normal. Minimal chronic interstitial prominence. Mild bibasilar subsegmental atelectasis. Chronic central peribronchial thickening, slightly decreased. No definite acute infiltrate, pleural effusion or pneumothorax. Bones demineralized. IMPRESSION: Bibasilar atelectasis without definite acute infiltrate.  Electronically Signed   By: Ulyses Southward M.D.   On: 11/21/2018 20:41  Echocardiogram done, normal ejection fraction.  No obvious diastolic dysfunction.   Subjective: Patient was seen and examined on the day of discharge.  Patient is poor historian.  She is not quite sure why she is in the hospital.  Denies any fever chills.  Denies any shortness of breath.  I discussed with her sister and patient is agreeable to go back to the skilled nursing facility.   Discharge Exam: Vitals:   11/23/18 0439 11/23/18 0759  BP: (!) 130/51   Pulse: (!) 42   Resp: 20   Temp: 97.8 F (36.6 C)   SpO2: 96% 96%   Vitals:   11/22/18 2128 11/23/18 0042 11/23/18 0439 11/23/18 0759  BP:  (!) 126/54 (!) 130/51   Pulse:  60 (!) 42   Resp:  20 20   Temp:  97.8 F (36.6 C) 97.8 F (36.6 C)   TempSrc:  Oral Oral   SpO2: 95% 95% 96% 96%  Weight:   88.6 kg   Height:        General: Pt is alert, awake, not in acute distress, she is on 2 L oxygen.  She is comfortable and eating breakfast. Cardiovascular: Irregularly irregular, S1/S2 +, no rubs, no gallops, patient has trace bilateral pedal edema. Respiratory: CTA bilaterally, no wheezing, no rhonchi Abdominal: Soft, NT, ND, bowel sounds +, obese and pendulous. Extremities: Trace edema, no cyanosis    The results of significant diagnostics from this hospitalization (including imaging, microbiology, ancillary and laboratory) are listed below for reference.     Microbiology: Recent Results (from the past 240 hour(s))  Blood Culture (routine x 2)     Status: None (Preliminary result)   Collection Time: 11/21/18  9:20 PM  Result Value Ref Range Status   Specimen Description BLOOD RIGHT ARM  Final   Special Requests   Final    BOTTLES DRAWN AEROBIC AND ANAEROBIC Blood Culture adequate volume   Culture   Final    NO GROWTH 2 DAYS Performed at [facility], [address] [place], [place] [address]    Report Status PENDING  Incomplete  SARS  Coronavirus 2 [facility] order, Performed in [facility] hospital lab)     Status: None   Collection Time: 11/21/18  9:41 PM  Result Value Ref Range Status   SARS Coronavirus 2 NEGATIVE NEGATIVE Final    Comment: (NOTE) If result is NEGATIVE SARS-CoV-2 target nucleic acids are NOT DETECTED. The SARS-CoV-2 RNA is generally detectable in upper and lower  respiratory specimens during the acute phase of infection. The lowest  concentration of SARS-CoV-2 viral copies this assay can detect is 250  copies / mL. A negative result does not preclude SARS-CoV-2 infection  and should not be used as the sole basis for treatment or other  patient management decisions.  A negative result may occur with  improper specimen collection / handling, submission of specimen other  than nasopharyngeal swab, presence of viral mutation(s) within the  areas targeted by this  assay, and inadequate number of viral copies  (<250 copies / mL). A negative result must be combined with clinical  observations, patient history, and epidemiological information. If result is POSITIVE SARS-CoV-2 target nucleic acids are DETECTED. The SARS-CoV-2 RNA is generally detectable in upper and lower  respiratory specimens dur ing the acute phase of infection.  Positive  results are indicative of active infection with SARS-CoV-2.  Clinical  correlation with patient history and other diagnostic information is  necessary to determine patient infection status.  Positive results do  not rule out bacterial infection or co-infection with other viruses. If result is PRESUMPTIVE POSTIVE SARS-CoV-2 nucleic acids MAY BE PRESENT.   A presumptive positive result was obtained on the submitted specimen  and confirmed on repeat testing.  While 2019 novel coronavirus  (SARS-CoV-2) nucleic acids may be present in the submitted sample  additional confirmatory testing may be necessary for epidemiological  and / or clinical management purposes  to  differentiate between  SARS-CoV-2 and other Sarbecovirus currently known to infect humans.  If clinically indicated additional testing with an alternate test  methodology [phone]([id number]) is advised. The SARS-CoV-2 RNA is generally  detectable in upper and lower respiratory sp ecimens during the acute  phase of infection. The expected result is Negative. Fact Sheet for Patients:  BoilerBrush.com.cyhttps://www.fda.gov/media/136312/download Fact Sheet for Healthcare Providers: https://pope.com/https://www.fda.gov/media/136313/download This test is not yet approved or cleared by the [place] FDA and has been authorized for detection and/or diagnosis of SARS-CoV-2 by FDA under an Emergency Use Authorization (EUA).  This EUA will remain in effect (meaning this test can be used) for the duration of the COVID-19 declaration under Section 564(b)(1) of the Act, 21 U.S.C. section 360bbb-3(b)(1), unless the authorization is terminated or revoked sooner. Performed at [facility], [address] [place], [place] [address]   Blood Culture (routine x 2)     Status: None (Preliminary result)   Collection Time: 11/21/18  9:41 PM  Result Value Ref Range Status   Specimen Description BLOOD RIGHT HAND  Final   Special Requests   Final    BOTTLES DRAWN AEROBIC ONLY Blood Culture results may not be optimal due to an inadequate volume of blood received in culture bottles   Culture   Final    NO GROWTH 2 DAYS Performed at [facility], [address] [place], [place] [address]    Report Status PENDING  Incomplete  MRSA PCR Screening     Status: None   Collection Time: 11/22/18  3:51 AM  Result Value Ref Range Status   MRSA by PCR NEGATIVE NEGATIVE Final    Comment:        The GeneXpert MRSA Assay (FDA approved for NASAL specimens only), is one component of a comprehensive MRSA colonization surveillance program. It is not intended to diagnose MRSA infection nor to guide or monitor treatment for MRSA  infections. Performed at [facility], [address] [place], [place] [address]      Labs: BNP (last 3 results) Recent Labs    03/07/18 1141 11/21/18 2141  BNP 200.3* 479.7*   Basic Metabolic Panel: Recent Labs  Lab 11/21/18 2141 11/22/18 0209 11/23/18 0957  NA 142 142 141  K 4.1 4.1 4.2  CL 103 105 102  CO2 27 25 26   GLUCOSE 88 90 448*  BUN 83* 78* 75*  CREATININE 2.12* 1.86* 1.78*  CALCIUM 9.2 9.2 8.6*   Liver Function Tests: Recent Labs  Lab 11/21/18 2141  11/22/18 0209  AST 62* 49*  ALT 35 38  ALKPHOS 95 100  BILITOT 0.4 0.5  PROT 8.2* 8.5*  ALBUMIN 2.7* 2.9*   No results for input(s): LIPASE, AMYLASE in the last 168 hours. No results for input(s): AMMONIA in the last 168 hours. CBC: Recent Labs  Lab 11/21/18 2141 11/22/18 0209  WBC 10.6* 10.8*  NEUTROABS 7.9*  --   HGB 9.4* 10.3*  HCT 31.3* 33.9*  MCV 89.9 89.4  PLT 217 209   Cardiac Enzymes: Recent Labs  Lab 11/22/18 0209 11/23/18 0410 11/23/18 1000  CKTOTAL 27*  --   --   CKMB 1.3  --   --   TROPONINI 0.05* 0.08* 0.05*   BNP: Invalid input(s): POCBNP CBG: Recent Labs  Lab 11/22/18 0646  GLUCAP 153*   D-Dimer Recent Labs    11/21/18 2141  DDIMER 0.70*   Hgb A1c No results for input(s): HGBA1C in the last 72 hours. Lipid Profile Recent Labs    11/21/18 2141  TRIG 40   Thyroid function studies No results for input(s): TSH, T4TOTAL, T3FREE, THYROIDAB in the last 72 hours.  Invalid input(s): FREET3 Anemia work up Recent Labs    11/21/18 2141 11/22/18 0209  VITAMINB12  --  850  FERRITIN 58  --   TIBC  --  333  IRON  --  16*   Urinalysis    Component Value Date/Time   COLORURINE YELLOW 11/22/2018 0933   APPEARANCEUR CLEAR 11/22/2018 0933   LABSPEC 1.012 11/22/2018 0933   PHURINE 5.0 11/22/2018 0933   GLUCOSEU NEGATIVE 11/22/2018 0933   HGBUR NEGATIVE 11/22/2018 0933   BILIRUBINUR NEGATIVE 11/22/2018 0933   KETONESUR NEGATIVE 11/22/2018 0933    PROTEINUR NEGATIVE 11/22/2018 0933   UROBILINOGEN 1.0 03/31/2015 2313   NITRITE NEGATIVE 11/22/2018 0933   LEUKOCYTESUR NEGATIVE 11/22/2018 0933   Sepsis Labs Invalid input(s): PROCALCITONIN,  WBC,  LACTICIDVEN Microbiology Recent Results (from the past 240 hour(s))  Blood Culture (routine x 2)     Status: None (Preliminary result)   Collection Time: 11/21/18  9:20 PM  Result Value Ref Range Status   Specimen Description BLOOD RIGHT ARM  Final   Special Requests   Final    BOTTLES DRAWN AEROBIC AND ANAEROBIC Blood Culture adequate volume   Culture   Final    NO GROWTH 2 DAYS Performed at [facility], [address] [place], [place] [address]    Report Status PENDING  Incomplete  SARS Coronavirus 2 [facility] order, Performed in [facility] hospital lab)     Status: None   Collection Time: 11/21/18  9:41 PM  Result Value Ref Range Status   SARS Coronavirus 2 NEGATIVE NEGATIVE Final    Comment: (NOTE) If result is NEGATIVE SARS-CoV-2 target nucleic acids are NOT DETECTED. The SARS-CoV-2 RNA is generally detectable in upper and lower  respiratory specimens during the acute phase of infection. The lowest  concentration of SARS-CoV-2 viral copies this assay can detect is 250  copies / mL. A negative result does not preclude SARS-CoV-2 infection  and should not be used as the sole basis for treatment or other  patient management decisions.  A negative result may occur with  improper specimen collection / handling, submission of specimen other  than nasopharyngeal swab, presence of viral mutation(s) within the  areas targeted by this assay, and inadequate number of viral copies  (<250 copies / mL). A negative result must be combined with clinical  observations, patient history,  and epidemiological information. If result is POSITIVE SARS-CoV-2 target nucleic acids are DETECTED. The SARS-CoV-2 RNA is generally detectable in upper and lower  respiratory specimens dur ing the  acute phase of infection.  Positive  results are indicative of active infection with SARS-CoV-2.  Clinical  correlation with patient history and other diagnostic information is  necessary to determine patient infection status.  Positive results do  not rule out bacterial infection or co-infection with other viruses. If result is PRESUMPTIVE POSTIVE SARS-CoV-2 nucleic acids MAY BE PRESENT.   A presumptive positive result was obtained on the submitted specimen  and confirmed on repeat testing.  While 2019 novel coronavirus  (SARS-CoV-2) nucleic acids may be present in the submitted sample  additional confirmatory testing may be necessary for epidemiological  and / or clinical management purposes  to differentiate between  SARS-CoV-2 and other Sarbecovirus currently known to infect humans.  If clinically indicated additional testing with an alternate test  methodology [phone]) is advised. The SARS-CoV-2 RNA is generally  detectable in upper and lower respiratory sp ecimens during the acute  phase of infection. The expected result is Negative. Fact Sheet for Patients:  BoilerBrush[employer]com.cy Fact Sheet for Healthcare Providers: https://pope.com/ This test is not yet approved or cleared by the [place] FDA and has been authorized for detection and/or diagnosis of SARS-CoV-2 by FDA under an Emergency Use Authorization (EUA).  This EUA will remain in effect (meaning this test can be used) for the duration of the COVID-19 declaration under Section 564(b)(1) of the Act, 21 U.S.C. section 360bbb-3(b)(1), unless the authorization is terminated or revoked sooner. Performed at [facility], [address] [place], [place] [address]   Blood Culture (routine x 2)     Status: None (Preliminary result)   Collection Time: 11/21/18  9:41 PM  Result Value Ref Range Status   Specimen Description BLOOD RIGHT HAND  Final   Special Requests   Final     BOTTLES DRAWN AEROBIC ONLY Blood Culture results may not be optimal due to an inadequate volume of blood received in culture bottles   Culture   Final    NO GROWTH 2 DAYS Performed at [facility], [address], [place], [place] [address]    Report Status PENDING  Incomplete  MRSA PCR Screening     Status: None   Collection Time: 11/22/18  3:51 AM  Result Value Ref Range Status   MRSA by PCR NEGATIVE NEGATIVE Final    Comment:        The GeneXpert MRSA Assay (FDA approved for NASAL specimens only), is one component of a comprehensive MRSA colonization surveillance program. It is not intended to diagnose MRSA infection nor to guide or monitor treatment for MRSA infections. Performed at [facility], [address] [place], [place] [address]      Time coordinating discharge: 32 minutes  SIGNED:   Dorcas Carrow, MD  [facility] 11/23/2018, 11:24 AM Pager [phone]  If 7PM-7AM, please contact night-coverage [email or web address] Password [email or web address]

## 2018-11-26 LAB — CULTURE, BLOOD (ROUTINE X 2)
Culture: NO GROWTH
Culture: NO GROWTH
Special Requests: ADEQUATE

## 2018-12-25 DEATH — deceased

## 2019-08-02 IMAGING — CT CT HEAD W/O CM
3 of 4 series · 15 of 47 positions shown, 18 images · non-contrast
Comparison: CT brain scan of 03/31/2015

CLINICAL DATA: Persistent headache

EXAM:
CT HEAD WITHOUT CONTRAST
TECHNIQUE: Contiguous axial images were obtained from the base of the skull
through the vertex without intravenous contrast.

[Series 2: head wo · axial · 0.47mm/px · z∈[-115,+5]mm · 9 of 30 slices shown, 12 images]
[im 3/30  brain]
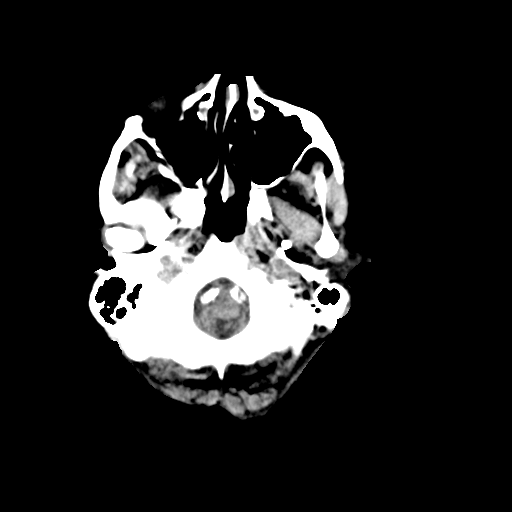
[im 3/30  bone]
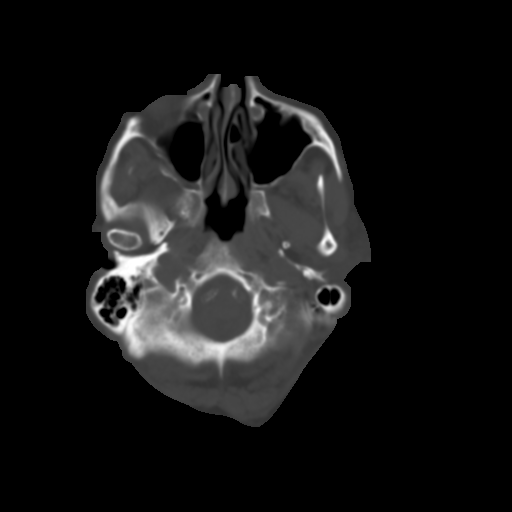
[im 7/30  brain]
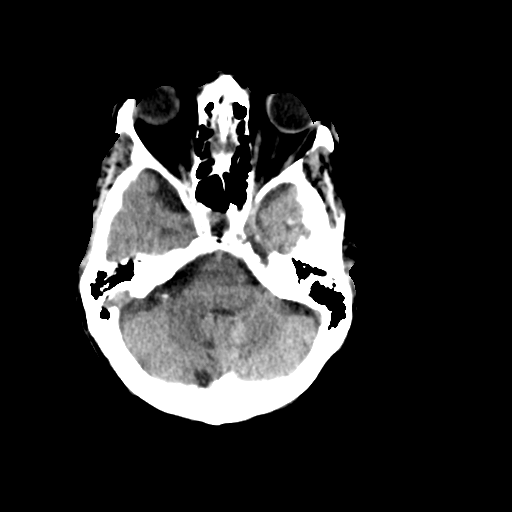
[im 9/30  brain]
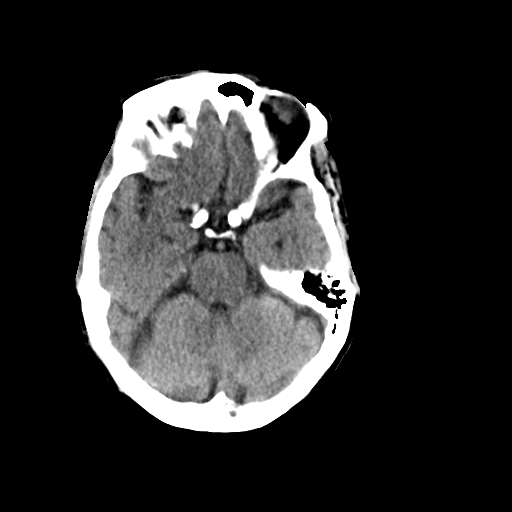
[im 13/30  brain]
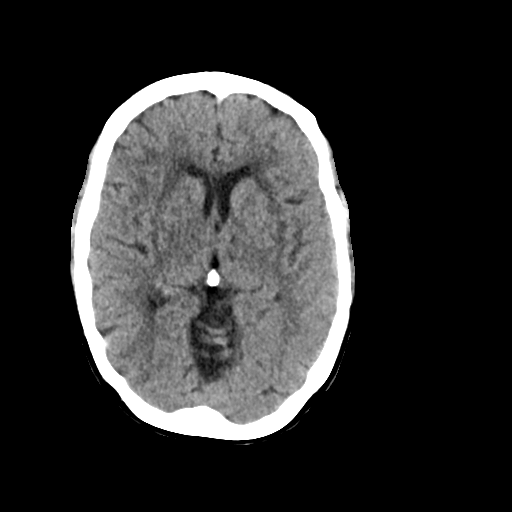
[im 15/30  brain]
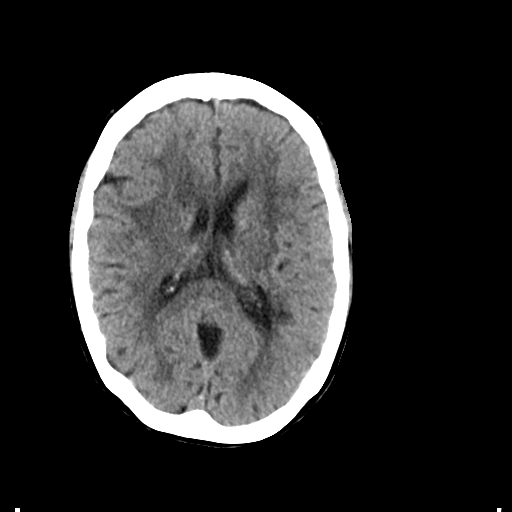
[im 15/30  bone]
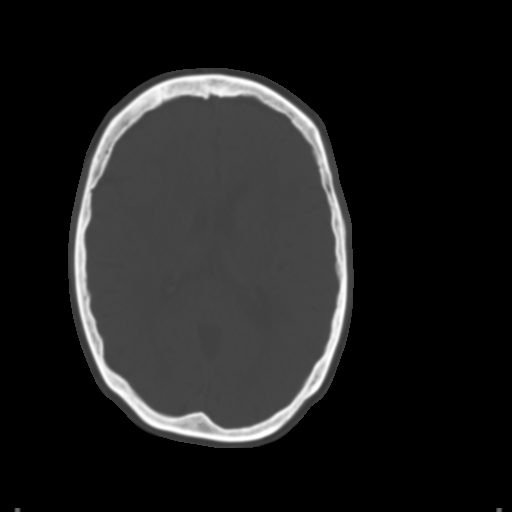
[im 17/30  brain]
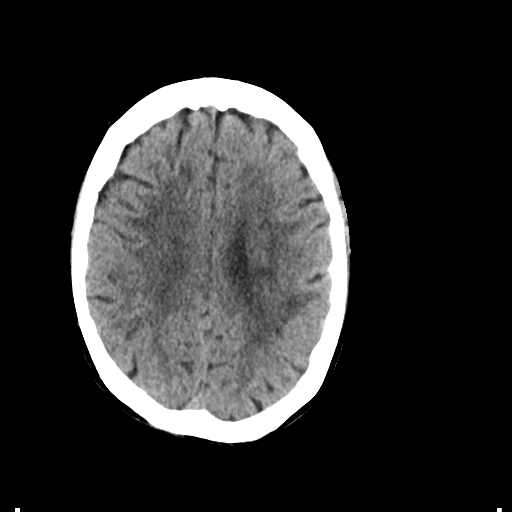
[im 21/30  brain]
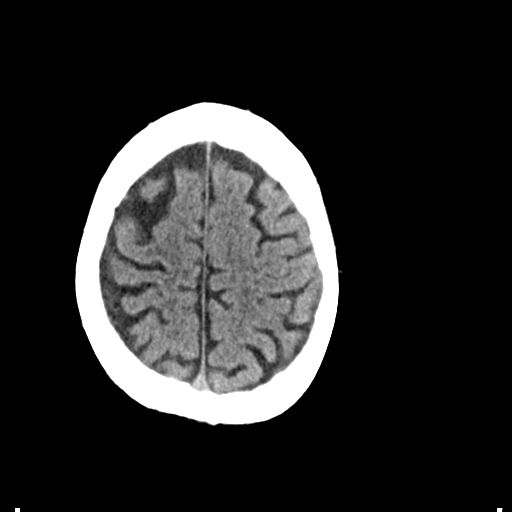
[im 23/30  brain]
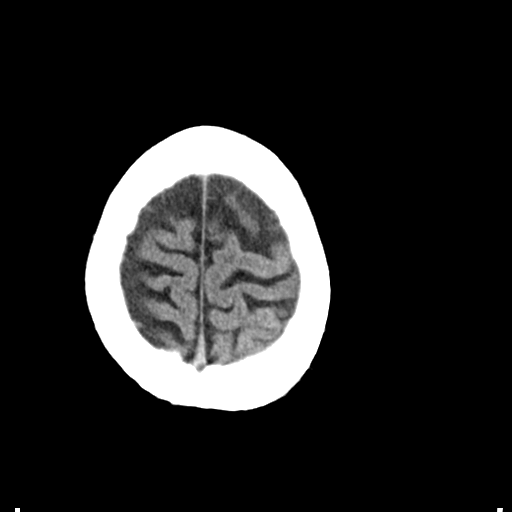
[im 27/30  brain]
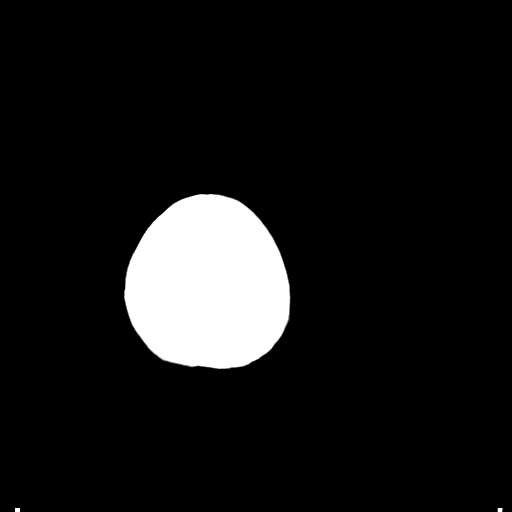
[im 27/30  bone]
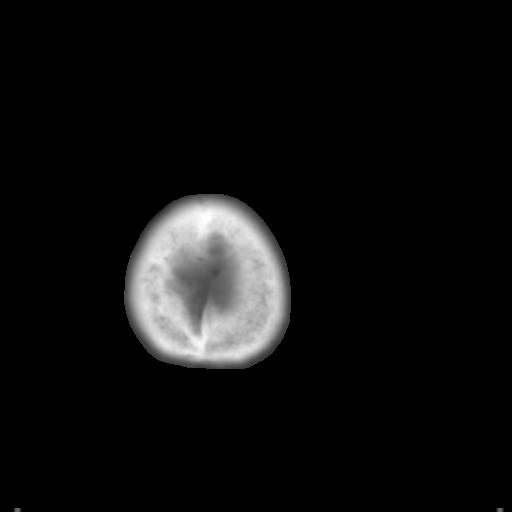

[Series 4: coronal soft tissue · coronal · 0.36mm/px · 3 of 61 slices shown]
[im 21/61  brain]
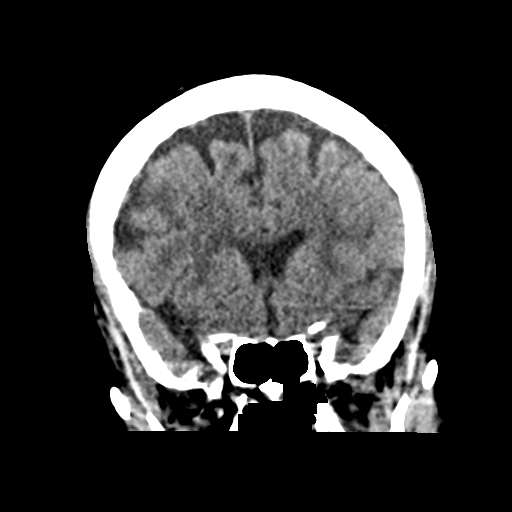
[im 27/61  brain]
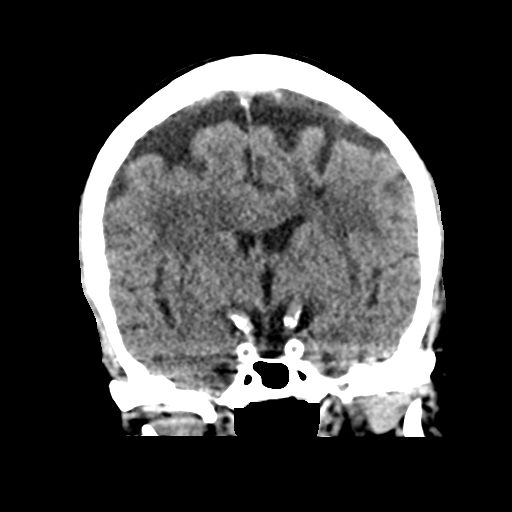
[im 34/61  brain]
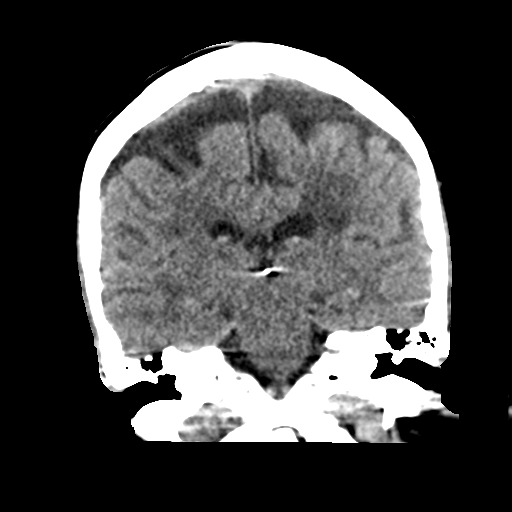

[Series 5: sagittal soft tissue · sagittal · 0.33mm/px · 3 of 46 slices shown]
[im 16/46  brain]
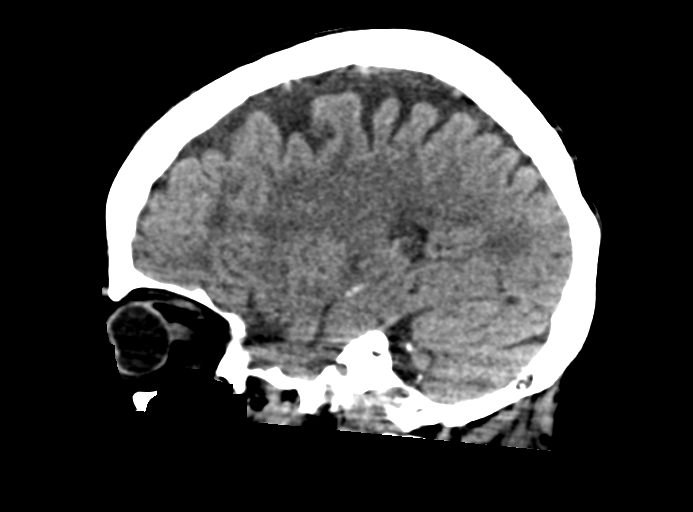
[im 23/46  brain]
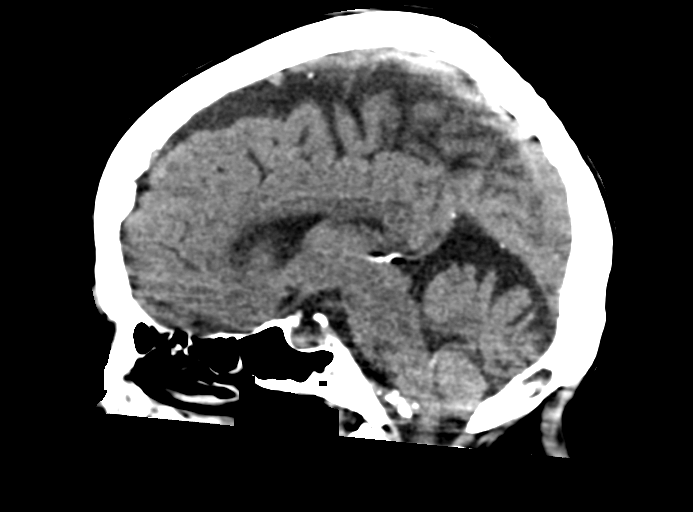
[im 31/46  brain]
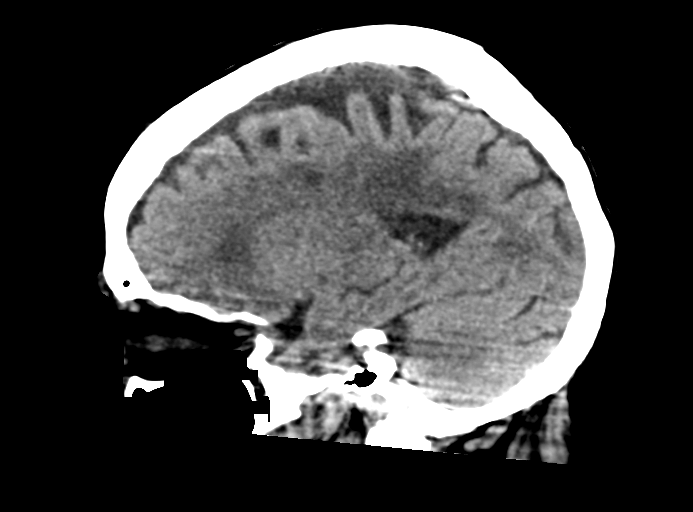

[15 of 47 positions shown; findings below may reference images not displayed]

FINDINGS: Brain: The ventricular system is unchanged and within normal limits
for age. The septum is midline in position. There is moderate small
vessel ischemic change throughout the periventricular white matter
which may have progressed slightly. Small lacunar infarcts are noted
on the right. No acute infarct, area of hemorrhage, or mass lesion
is seen.

Vascular: Dense calcification of the vertebral and basilar arteries
is noted.

Skull: On bone window images, no acute calvarial abnormality is
seen.

Sinuses/Orbits: The paranasal sinuses are well pneumatized.

Other: None.
IMPRESSION: Little change in moderate small vessel ischemic change throughout
the periventricular white matter. No acute intracranial abnormality.
